# Patient Record
Sex: Female | Born: 1990
Health system: Southern US, Community
[De-identification: ages and names within clinical notes are randomized; demographics above are authoritative.]

## PROBLEM LIST (undated history)

## (undated) DIAGNOSIS — Z87410 Personal history of cervical dysplasia: Secondary | ICD-10-CM

## (undated) DIAGNOSIS — R102 Pelvic and perineal pain unspecified side: Secondary | ICD-10-CM

## (undated) DIAGNOSIS — A549 Gonococcal infection, unspecified: Secondary | ICD-10-CM

## (undated) DIAGNOSIS — A749 Chlamydial infection, unspecified: Secondary | ICD-10-CM

## (undated) DIAGNOSIS — Z8614 Personal history of Methicillin resistant Staphylococcus aureus infection: Secondary | ICD-10-CM

## (undated) DIAGNOSIS — K219 Gastro-esophageal reflux disease without esophagitis: Secondary | ICD-10-CM

## (undated) DIAGNOSIS — N920 Excessive and frequent menstruation with regular cycle: Secondary | ICD-10-CM

## (undated) DIAGNOSIS — D259 Leiomyoma of uterus, unspecified: Secondary | ICD-10-CM

## (undated) DIAGNOSIS — Z8619 Personal history of other infectious and parasitic diseases: Secondary | ICD-10-CM

## (undated) DIAGNOSIS — T7840XA Allergy, unspecified, initial encounter: Secondary | ICD-10-CM

## (undated) HISTORY — DX: Gastro-esophageal reflux disease without esophagitis: K21.9

## (undated) HISTORY — DX: Allergy, unspecified, initial encounter: T78.40XA

## (undated) HISTORY — PX: NO PAST SURGERIES: SHX2092

---
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
## 2010-03-22 ENCOUNTER — Emergency Department (HOSPITAL_COMMUNITY): Admission: EM | Admit: 2010-03-22 | Discharge: 2010-03-22 | Payer: Self-pay | Admitting: Emergency Medicine

## 2011-03-29 LAB — POCT URINALYSIS DIP (DEVICE)
Nitrite: NEGATIVE
Protein, ur: NEGATIVE mg/dL
Urobilinogen, UA: 1 mg/dL (ref 0.0–1.0)
pH: 7 (ref 5.0–8.0)

## 2011-03-29 LAB — WET PREP, GENITAL
Trich, Wet Prep: NONE SEEN
Yeast Wet Prep HPF POC: NONE SEEN

## 2011-03-29 LAB — GC/CHLAMYDIA PROBE AMP, GENITAL: Chlamydia, DNA Probe: POSITIVE — AB

## 2011-03-29 LAB — POCT PREGNANCY, URINE: Preg Test, Ur: NEGATIVE

## 2011-03-29 LAB — URINE CULTURE: Colony Count: 1000

## 2012-05-24 ENCOUNTER — Encounter (HOSPITAL_COMMUNITY): Payer: Self-pay | Admitting: *Deleted

## 2012-05-24 ENCOUNTER — Emergency Department (HOSPITAL_COMMUNITY)
Admission: EM | Admit: 2012-05-24 | Discharge: 2012-05-25 | Disposition: A | Discharge status: Home / Self Care | Payer: Self-pay | Attending: Emergency Medicine | Admitting: Emergency Medicine

## 2012-05-24 DIAGNOSIS — R21 Rash and other nonspecific skin eruption: Secondary | ICD-10-CM | POA: Insufficient documentation

## 2012-05-24 MED ORDER — FAMOTIDINE 20 MG PO TABS
20.0000 mg | ORAL_TABLET | Freq: Once | ORAL | Status: AC
  Administered 2012-05-25: 20 mg via ORAL
  Filled 2012-05-24: qty 1

## 2012-05-24 MED ORDER — DIPHENHYDRAMINE HCL 25 MG PO CAPS
25.0000 mg | ORAL_CAPSULE | Freq: Once | ORAL | Status: AC
  Administered 2012-05-25: 25 mg via ORAL
  Filled 2012-05-24: qty 1

## 2012-05-24 MED ORDER — PREDNISONE 20 MG PO TABS
40.0000 mg | ORAL_TABLET | Freq: Once | ORAL | Status: AC
  Administered 2012-05-25: 40 mg via ORAL
  Filled 2012-05-24: qty 2

## 2012-05-24 NOTE — ED Provider Notes (Signed)
History     CSN: [id number]  Arrival date & time 05/24/12  2034   First MD Initiated Contact with Patient 05/24/12 2305      Chief Complaint  Patient presents with  . Insect Bite    (Consider location/radiation/quality/duration/timing/severity/associated sxs/prior treatment) HPI Comments: Patient comes in today with a rash on her right arm, chest, and posterior left leg.  She reports that she first noticed the rash today.  She thinks that she may have been bitten by a mosquito.  She also reports that she recently started using a new body wash from [place] that she has never used before.  She reports that the rash does itch.  She has been putting Hydrocortisone cream on the area, which has helped with the itching.  She denies any shortness of breath or swelling of her throat, lips, or tongue.  Patient is a 21 y.o. female presenting with rash. The history is provided by the patient.  Rash  This is a new problem. The current episode started 12 to 24 hours ago. There has been no fever. The patient is experiencing no pain. Associated symptoms include itching. Pertinent negatives include no blisters, no pain and no weeping.    History reviewed. No pertinent past medical history.  History reviewed. No pertinent past surgical history.  History reviewed. No pertinent family history.  History  Substance Use Topics  . Smoking status: Not on file  . Smokeless tobacco: Not on file  . Alcohol Use: Yes    OB History    Grav Para Term Preterm Abortions TAB SAB Ect Mult Living                  Review of Systems  Constitutional: Negative for fever and chills.  HENT: Negative for sore throat.   Respiratory: Negative for shortness of breath and wheezing.   Gastrointestinal: Negative for nausea and vomiting.  Musculoskeletal: Negative for joint swelling.  Skin: Positive for itching and rash.  Neurological: Negative for headaches.    Allergies  Penicillins  Home Medications  No  current outpatient prescriptions on file.  BP 124/66  Pulse 88  Temp(Src) 98.3 F (36.8 C) (Oral)  Resp 20  Ht 5' (1.524 m)  Wt 125 lb (56.7 kg)  BMI 24.41 kg/m2  SpO2 100%  LMP 05/16/2012  Physical Exam  Nursing note and vitals reviewed. Constitutional: She is oriented to person, place, and time. She appears well-developed and well-nourished. No distress.  HENT:  Head: Normocephalic and atraumatic.  Mouth/Throat: Oropharynx is clear and moist and mucous membranes are normal.       No sign of airway obstruction. No edema of face, eyelids, lips, tongue, uvula.Marland Kitchen Uvula midline, no nasal congestion or drooling.  Tongue not elevated. No trismus.  Neck: Trachea normal, normal range of motion and full passive range of motion without pain. Neck supple.       No carotid bruits or stridor  Cardiovascular: Normal rate, regular rhythm, normal heart sounds, intact distal pulses and normal pulses.        Not tachycardic  Pulmonary/Chest: Effort normal and breath sounds normal. No stridor. No respiratory distress. She has no wheezes.  Musculoskeletal: Normal range of motion.  Neurological: She is alert and oriented to person, place, and time.  Skin: Skin is warm, dry and intact. Rash noted. No petechiae and no purpura noted. She is not diaphoretic.       Erythematous papular rash located on the left arm, chest, and right leg.  Psychiatric: She has a normal mood and affect. Her behavior is normal.    ED Course  Procedures (including critical care time)  Labs Reviewed - No data to display No results found.   No diagnosis found.    MDM  Patient presenting with erythematous papular rash located on the right arm, chest, and left leg.  No petechia or purpura.  Afebrile.  Patient recently started using a new body wash.  Feel that rash is most likely Allergic Dermatitis.  No swelling of tongue, lips, or throat.  No respiratory distress.  Patient given Prednisone, Benadryl, and Pepcid while in  the ED.  Patient instructed to stop using body wash.  Patient instructed to follow up with PCP.        Pascal Lux [place], PA-C 05/25/12 [phone]

## 2012-05-24 NOTE — ED Notes (Signed)
Pt c/o insect bite to right forearm and left breast. Noticed marks yesterday. States she is allergic to mosquito bites. No relief with home remedies. Reports rash itches and burns. Small blisters noted to arm.

## 2012-05-25 MED ORDER — PREDNISONE 20 MG PO TABS: 40.0000 mg | ORAL_TABLET | Freq: Every day | ORAL | Status: AC

## 2012-05-25 NOTE — ED Provider Notes (Signed)
Medical screening examination/treatment/procedure(s) were performed by non-physician practitioner and as supervising physician I was immediately available for consultation/collaboration.   Celene Kras, MD 05/25/12 1102

## 2012-05-25 NOTE — Discharge Instructions (Signed)

## 2012-05-25 NOTE — ED Notes (Signed)
Patient discharge via ambulatory with steady gait. Respirations equal and unlabored. Skin warm and dry. No acute distress noted. 

## 2012-08-23 ENCOUNTER — Inpatient Hospital Stay (HOSPITAL_COMMUNITY)
Admission: AD | Admit: 2012-08-23 | Discharge: 2012-08-23 | Disposition: A | Discharge status: Home / Self Care | Payer: Self-pay | Source: Ambulatory Visit | Attending: Obstetrics & Gynecology | Admitting: Obstetrics & Gynecology

## 2012-08-23 ENCOUNTER — Inpatient Hospital Stay (HOSPITAL_COMMUNITY): Payer: Self-pay

## 2012-08-23 ENCOUNTER — Encounter (HOSPITAL_COMMUNITY): Payer: Self-pay

## 2012-08-23 DIAGNOSIS — N949 Unspecified condition associated with female genital organs and menstrual cycle: Secondary | ICD-10-CM | POA: Insufficient documentation

## 2012-08-23 DIAGNOSIS — R1032 Left lower quadrant pain: Secondary | ICD-10-CM | POA: Insufficient documentation

## 2012-08-23 DIAGNOSIS — N938 Other specified abnormal uterine and vaginal bleeding: Secondary | ICD-10-CM | POA: Insufficient documentation

## 2012-08-23 DIAGNOSIS — N946 Dysmenorrhea, unspecified: Secondary | ICD-10-CM | POA: Insufficient documentation

## 2012-08-23 HISTORY — DX: Chlamydial infection, unspecified: A74.9

## 2012-08-23 HISTORY — DX: Gonococcal infection, unspecified: A54.9

## 2012-08-23 HISTORY — DX: Personal history of Methicillin resistant Staphylococcus aureus infection: Z86.14

## 2012-08-23 LAB — CBC
HCT: 37.4 % (ref 36.0–46.0)
MCHC: 34 g/dL (ref 30.0–36.0)
MCV: 87 fL (ref 78.0–100.0)
Platelets: 209 10*3/uL (ref 150–400)
RBC: 4.3 MIL/uL (ref 3.87–5.11)
RDW: 12.2 % (ref 11.5–15.5)
WBC: 8.6 10*3/uL (ref 4.0–10.5)

## 2012-08-23 LAB — WET PREP, GENITAL
Clue Cells Wet Prep HPF POC: NONE SEEN
Trich, Wet Prep: NONE SEEN
Yeast Wet Prep HPF POC: NONE SEEN

## 2012-08-23 MED ORDER — KETOROLAC TROMETHAMINE 60 MG/2ML IM SOLN
60.0000 mg | INTRAMUSCULAR | Status: AC
  Administered 2012-08-23: 60 mg via INTRAMUSCULAR
  Filled 2012-08-23: qty 2

## 2012-08-23 NOTE — MAU Provider Note (Signed)
Attestation of Attending Supervision of Advanced Practitioner (CNM/NP): Evaluation and management procedures were performed by the Advanced Practitioner under my supervision and collaboration.  I have reviewed the Advanced Practitioner's note and chart, and I agree with the management and plan.  HARRAWAY-SMITH, Feliza Diven 12:49 PM     

## 2012-08-23 NOTE — MAU Provider Note (Signed)
Chief Complaint: Vaginal Bleeding and Abdominal Pain   None    SUBJECTIVE HPI: Kimberly Cooper is a 21 y.o. No obstetric history on file. at Unknown by LMP who presents to maternity admissions reporting heavy vaginal bleeding and LLQ abdominal pain x24 hours.  She reports taking Plan B 3 weeks ago, and had some scant bleeding 1 week after this, approximately 2 weeks ago but has not had a normal period since taking this medication.  Patient's last menstrual period was 07/09/2012.  She is changing her pad every 2-3 hours today and denies seeing clots on the pad.  She denies vaginal itching/burning, urinary symptoms, h/a, dizziness, fatigue, n/v, or fever/chills.     History reviewed. No pertinent past medical history. History reviewed. No pertinent past surgical history. History   Social History  . Marital Status: Single    Spouse Name: N/A    Number of Children: N/A  . Years of Education: N/A   Occupational History  . Not on file.   Social History Main Topics  . Smoking status: Not on file  . Smokeless tobacco: Not on file  . Alcohol Use: Yes  . Drug Use: No  . Sexually Active:    Other Topics Concern  . Not on file   Social History Narrative  . No narrative on file   No current facility-administered medications on file prior to encounter.   No current outpatient prescriptions on file prior to encounter.   Allergies  Allergen Reactions  . Penicillins     ROS: Pertinent items in HPI  OBJECTIVE Blood pressure 121/69, pulse 108, temperature 98.3 F (36.8 C), temperature source Oral, resp. rate 22, last menstrual period 07/09/2012. GENERAL: Well-developed, well-nourished female in no acute distress.  HEENT: Normocephalic HEART: normal rate RESP: normal effort ABDOMEN: Soft, non-tender EXTREMITIES: Nontender, no edema NEURO: Alert and oriented Pelvic exam: Cervix pink, visually closed, without lesion, moderate amount bright red bleeding pooling in posterior fornix and  from cervical os, vaginal walls and external genitalia normal Bimanual exam: Cervix 0/long/high, firm, anterior, neg CMT, uterus mildly tender, nonenlarged, adnexa without tenderness, enlargement, or mass  LAB RESULTS Results for orders placed during the hospital encounter of 08/23/12 (from the past 24 hour(s))  POCT PREGNANCY, URINE     Status: Normal   Collection Time   08/23/12  9:49 AM      Component Value Range   Preg Test, Ur NEGATIVE  NEGATIVE  Wet prep/GCC pending  IMAGING US Transvaginal Non-ob  08/23/2012  *RADIOLOGY REPORT*  Clinical Data: Left lower quadrant pain.  Vaginal bleeding. Negative pregnancy test.  TRANSABDOMINAL AND TRANSVAGINAL ULTRASOUND OF PELVIS  Technique:  Both transabdominal and transvaginal ultrasound examinations of the pelvis were performed.  Transabdominal technique was performed for global imaging of the pelvis including uterus, ovaries, adnexal regions, and pelvic cul-de-sac.  It was necessary to proceed with endovaginal exam following the transabdominal exam to visualize the right ovary and endometrial stripe.  Comparison:  None.  Findings: Uterus:  7.7 x 3.0 x 3.9 cm.  No fibroids or other uterine mass identified.  Endometrium: Double layer thickness measures 6 mm transvaginally. No focal lesion visualized.  Right ovary: 2.4 x 1.4 x 2.9 cm.  2 cm avascular corpus luteum noted, but no ovarian or adnexal mass identified.  Left ovary: 3.0 x 1.9 x 2.3 cm.  Normal appearance.  No adnexal mass identified.  Other Findings:  Tiny amount of simple free fluid in the cul-de- sac.  IMPRESSION: No evidence of  pelvic mass or other significant abnormality.   Original Report Authenticated By: Danae Orleans, M.D.    US Pelvis Complete  08/23/2012  *RADIOLOGY REPORT*  Clinical Data: Left lower quadrant pain.  Vaginal bleeding. Negative pregnancy test.  TRANSABDOMINAL AND TRANSVAGINAL ULTRASOUND OF PELVIS  Technique:  Both transabdominal and transvaginal ultrasound examinations of the  pelvis were performed.  Transabdominal technique was performed for global imaging of the pelvis including uterus, ovaries, adnexal regions, and pelvic cul-de-sac.  It was necessary to proceed with endovaginal exam following the transabdominal exam to visualize the right ovary and endometrial stripe.  Comparison:  None.  Findings: Uterus:  7.7 x 3.0 x 3.9 cm.  No fibroids or other uterine mass identified.  Endometrium: Double layer thickness measures 6 mm transvaginally. No focal lesion visualized.  Right ovary: 2.4 x 1.4 x 2.9 cm.  2 cm avascular corpus luteum noted, but no ovarian or adnexal mass identified.  Left ovary: 3.0 x 1.9 x 2.3 cm.  Normal appearance.  No adnexal mass identified.  Other Findings:  Tiny amount of simple free fluid in the cul-de- sac.  IMPRESSION: No evidence of pelvic mass or other significant abnormality.   Original Report Authenticated By: Danae Orleans, M.D.     ASSESSMENT 1. Dysmenorrhea     PLAN Toradol 60 mg IM x1 dose in MAU with significant relief Discharge home with bleeding precautions Ibuprofen 400-600 mg OTC PRN pain Return to MAU as needed    Sharen Counter Certified Nurse-Midwife 08/23/2012  9:58 AM

## 2012-08-23 NOTE — MAU Note (Signed)
Patient is here with c/o heavy bleeding and abdominal pain. She states that she took plan b last month and had 2 days of spotting. She states that she have not had normal period.

## 2012-08-24 LAB — GC/CHLAMYDIA PROBE AMP, GENITAL: GC Probe Amp, Genital: NEGATIVE

## 2013-03-27 ENCOUNTER — Emergency Department (HOSPITAL_COMMUNITY)
Admission: EM | Admit: 2013-03-27 | Discharge: 2013-03-27 | Disposition: A | Discharge status: Home / Self Care | Payer: Self-pay | Attending: Emergency Medicine | Admitting: Emergency Medicine

## 2013-03-27 ENCOUNTER — Encounter (HOSPITAL_COMMUNITY): Payer: Self-pay | Admitting: Emergency Medicine

## 2013-03-27 DIAGNOSIS — L272 Dermatitis due to ingested food: Secondary | ICD-10-CM | POA: Insufficient documentation

## 2013-03-27 DIAGNOSIS — Z8619 Personal history of other infectious and parasitic diseases: Secondary | ICD-10-CM | POA: Insufficient documentation

## 2013-03-27 DIAGNOSIS — Z8614 Personal history of Methicillin resistant Staphylococcus aureus infection: Secondary | ICD-10-CM | POA: Insufficient documentation

## 2013-03-27 DIAGNOSIS — T7840XA Allergy, unspecified, initial encounter: Secondary | ICD-10-CM

## 2013-03-27 MED ORDER — LORATADINE 10 MG PO TABS: 10.0000 mg | ORAL_TABLET | Freq: Every day | ORAL | Status: DC

## 2013-03-27 MED ORDER — PREDNISONE 10 MG PO TABS: 10.0000 mg | ORAL_TABLET | Freq: Every day | ORAL | Status: DC

## 2013-03-27 NOTE — ED Notes (Signed)
MD at bedside. 

## 2013-03-27 NOTE — ED Provider Notes (Signed)
History     CSN: [id number]  Arrival date & time 03/27/13  1102   First MD Initiated Contact with Patient 03/27/13 1209      Chief Complaint  Patient presents with  . Allergic Reaction    (Consider location/radiation/quality/duration/timing/severity/associated sxs/prior Treatment)   HPI   patient presents to the emergency department with complaints of a rash to her h she said that she has she ate Cashews yesterday and her top lip got a little swollen and then she developed a rash to her hands and legs. She no longer has swelling to her face, shortness of breath, her throat is not tight. She continues to have a rash to her hands she has been scratching. She came into the emergency department for evaluation. Nad vss  Past Medical History  Diagnosis Date  . Chlamydia   . Gonorrhea   . MRSA infection greater than 3 months ago 2003    ABDOMINAL CYST    History reviewed. No pertinent past surgical history.  No family history on file.  History  Substance Use Topics  . Smoking status: Never Smoker   . Smokeless tobacco: Not on file  . Alcohol Use: Yes    OB History   Grav Para Term Preterm Abortions TAB SAB Ect Mult Living                  Review of Systems  All other systems reviewed and are negative.    Allergies  Penicillins  Home Medications   Current Outpatient Rx  Name  Route  Sig  Dispense  Refill  . loratadine (CLARITIN) 10 MG tablet   Oral   Take 1 tablet (10 mg total) by mouth daily.   14 tablet   0   . predniSONE (DELTASONE) 10 MG tablet   Oral   Take 1 tablet (10 mg total) by mouth daily.   21 tablet   0     6 tabs on day 1, 5 tabs on day 2, 4 tabs on day 3, ...     BP 118/71  Pulse 99  Temp(Src) 98.6 F (37 C) (Oral)  Resp 18  SpO2 100%  Physical Exam  Nursing note and vitals reviewed. Constitutional: She appears well-developed and well-nourished. No distress.  HENT:  Head: Normocephalic and atraumatic.  Mouth/Throat:  Oropharynx is clear and moist.  Eyes: Pupils are equal, round, and reactive to light.  Neck: Normal range of motion. Neck supple.  Cardiovascular: Normal rate and regular rhythm.   Pulmonary/Chest: Effort normal. She has no wheezes. She has no rales.  Abdominal: Soft.  Neurological: She is alert.  Skin: Skin is warm and dry.  Dermatitis to hands and lower extremities that is excoriated.     ED Course  Procedures (including critical care time)  Labs Reviewed - No data to display No results found.   1. Allergic reaction, initial encounter       MDM  Pt advised to take benadryl at home. No concerning symptoms of angioedema at this time. Pts symptoms are mild, vital signs are stable. She has never had an allergy to nuts and is unsure if this is what caused it. Advised to stay away from nuts for now on.  rx: Prednisone and Claritin.  Pt has been advised of the symptoms that warrant their return to the ED. Patient has voiced understanding and has agreed to follow-up with the PCP or specialist.         Dorthula Matas, PA-C  03/27/13 1232 

## 2013-03-27 NOTE — ED Notes (Signed)
Pt states that yesterday she ate cashews and noticed some swelling to lips and took benadryl. No distress noted at this time. Intermit sob

## 2013-03-29 NOTE — ED Provider Notes (Signed)
Medical screening examination/treatment/procedure(s) were performed by non-physician practitioner and as supervising physician I was immediately available for consultation/collaboration.   Suzi Roots, MD 03/29/13 [phone]

## 2014-05-26 ENCOUNTER — Emergency Department (HOSPITAL_COMMUNITY)
Admission: EM | Admit: 2014-05-26 | Discharge: 2014-05-26 | Disposition: A | Discharge status: Home / Self Care | Payer: PRIVATE HEALTH INSURANCE | Attending: Emergency Medicine | Admitting: Emergency Medicine

## 2014-05-26 ENCOUNTER — Encounter (HOSPITAL_COMMUNITY): Payer: Self-pay | Admitting: Emergency Medicine

## 2014-05-26 DIAGNOSIS — Z8614 Personal history of Methicillin resistant Staphylococcus aureus infection: Secondary | ICD-10-CM | POA: Insufficient documentation

## 2014-05-26 DIAGNOSIS — Z880 Allergy status to penicillin: Secondary | ICD-10-CM | POA: Insufficient documentation

## 2014-05-26 DIAGNOSIS — Z8619 Personal history of other infectious and parasitic diseases: Secondary | ICD-10-CM | POA: Insufficient documentation

## 2014-05-26 DIAGNOSIS — Y990 Civilian activity done for income or pay: Secondary | ICD-10-CM | POA: Insufficient documentation

## 2014-05-26 DIAGNOSIS — S39012A Strain of muscle, fascia and tendon of lower back, initial encounter: Secondary | ICD-10-CM

## 2014-05-26 DIAGNOSIS — S335XXA Sprain of ligaments of lumbar spine, initial encounter: Secondary | ICD-10-CM | POA: Insufficient documentation

## 2014-05-26 DIAGNOSIS — Y929 Unspecified place or not applicable: Secondary | ICD-10-CM | POA: Insufficient documentation

## 2014-05-26 DIAGNOSIS — X500XXA Overexertion from strenuous movement or load, initial encounter: Secondary | ICD-10-CM | POA: Insufficient documentation

## 2014-05-26 DIAGNOSIS — Y9389 Activity, other specified: Secondary | ICD-10-CM | POA: Insufficient documentation

## 2014-05-26 MED ORDER — PREDNISONE 50 MG PO TABS: 50.0000 mg | ORAL_TABLET | Freq: Every day | ORAL | Status: DC

## 2014-05-26 MED ORDER — IBUPROFEN 800 MG PO TABS
800.0000 mg | ORAL_TABLET | Freq: Once | ORAL | Status: AC
  Administered 2014-05-26: 800 mg via ORAL
  Filled 2014-05-26: qty 1

## 2014-05-26 MED ORDER — HYDROCODONE-ACETAMINOPHEN 5-325 MG PO TABS
1.0000 | ORAL_TABLET | Freq: Once | ORAL | Status: AC
  Administered 2014-05-26: 1 via ORAL
  Filled 2014-05-26: qty 1

## 2014-05-26 MED ORDER — HYDROCODONE-ACETAMINOPHEN 5-325 MG PO TABS: 1.0000 | ORAL_TABLET | Freq: Four times a day (QID) | ORAL | Status: DC | PRN

## 2014-05-26 MED ORDER — IBUPROFEN 800 MG PO TABS: 800.0000 mg | ORAL_TABLET | Freq: Three times a day (TID) | ORAL | Status: DC | PRN

## 2014-05-26 MED ORDER — CYCLOBENZAPRINE HCL 10 MG PO TABS: 10.0000 mg | ORAL_TABLET | Freq: Three times a day (TID) | ORAL | Status: DC | PRN

## 2014-05-26 NOTE — ED Notes (Signed)
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

## 2014-05-26 NOTE — ED Provider Notes (Signed)
CSN: [id number]     Arrival date & time 05/26/14  2032 History   First MD Initiated Contact with Patient 05/26/14 2046    This chart was scribed for non-physician practitioner, Irena Cords, PA, working with Babette Relic, MD by Terressa Koyanagi, ED Scribe. This patient was seen in room WTR8/WTR8 and the patient's care was started at 9:20 PM.  Chief Complaint  Patient presents with  . Back Pain   The history is provided by the patient. No language interpreter was used.   HPI Comments: Kimberly Cooper is a 23 y.o. female, with a history of chlamydia, gonorrhea, and MRSA, who presents to the Emergency Department complaining of back pain onset around 6:15PM. Pt reports that she was at work  and while lifting someone up in a bed she began to experience sharp, shooting pain across the middle of her back and left side of her back. Pt reports using heating pad at home without much relief. Pt denies nausea or vomiting.   Past Medical History  Diagnosis Date  . Chlamydia   . Gonorrhea   . MRSA infection greater than 3 months ago 2003    ABDOMINAL CYST   History reviewed. No pertinent past surgical history. No family history on file. History  Substance Use Topics  . Smoking status: Never Smoker   . Smokeless tobacco: Not on file  . Alcohol Use: Yes   OB History   Grav Para Term Preterm Abortions TAB SAB Ect Mult Living                 Review of Systems  Musculoskeletal: Positive for back pain.    A complete 10 system review of systems was obtained and all systems are negative except as noted in the HPI and PMH.    Allergies  Other and Penicillins  Home Medications   Prior to Admission medications   Not on File   Triage Vitals: BP 135/65  Pulse 85  Temp(Src) 98.5 F (36.9 C) (Oral)  Resp 18  SpO2 99%  LMP 05/23/2014  Physical Exam  Nursing note and vitals reviewed. Constitutional: She is oriented to person, place, and time. She appears well-developed and well-nourished. No  distress.  HENT:  Head: Normocephalic and atraumatic.  Eyes: Conjunctivae and EOM are normal.  Neck: Normal range of motion. No tracheal deviation present.  Cardiovascular: Normal rate.   Pulmonary/Chest: Effort normal. No respiratory distress.  Musculoskeletal: Normal range of motion.       Lumbar back: She exhibits tenderness, pain and spasm. She exhibits normal range of motion, no bony tenderness, no edema, no deformity, no laceration and normal pulse.       Back:  Neurological: She is alert and oriented to person, place, and time. She has normal reflexes. No sensory deficit. She exhibits normal muscle tone. Coordination and gait normal. GCS eye subscore is 4. GCS verbal subscore is 5. GCS motor subscore is 6.  Skin: Skin is warm and dry.  Psychiatric: She has a normal mood and affect. Her behavior is normal.    ED Course  Procedures (including critical care time) DIAGNOSTIC STUDIES:   COORDINATION OF CARE: 9:23 PM-Discussed treatment plan which includes meds, icing the affected area, work note, light activity, with pt. Patient verbalizes understanding and agrees with treatment plan.  Patient retreated for lumbar and thoracic strain based on her mechanism of injury.  The patient is advised to return here as needed.  Patient is advised to use ice and heat  on areas that are hurting   Brent General, PA-C 05/27/14 [phone]

## 2014-05-26 NOTE — ED Notes (Signed)
Pt took 600mg  Ibuprofen

## 2014-05-26 NOTE — Discharge Instructions (Signed)
Return here as needed. Follow up with your doctor. Ice and heat on your back.

## 2014-05-27 NOTE — ED Provider Notes (Signed)
Medical screening examination/treatment/procedure(s) were performed by non-physician practitioner and as supervising physician I was immediately available for consultation/collaboration.   EKG Interpretation None       Babette Relic, MD 05/27/14 1409

## 2014-06-04 ENCOUNTER — Ambulatory Visit: Payer: Self-pay

## 2014-06-04 ENCOUNTER — Other Ambulatory Visit: Payer: Self-pay | Admitting: Occupational Medicine

## 2014-06-04 DIAGNOSIS — R52 Pain, unspecified: Secondary | ICD-10-CM

## 2016-02-28 DIAGNOSIS — L810 Postinflammatory hyperpigmentation: Secondary | ICD-10-CM | POA: Diagnosis not present

## 2016-02-28 DIAGNOSIS — L910 Hypertrophic scar: Secondary | ICD-10-CM | POA: Diagnosis not present

## 2016-02-28 DIAGNOSIS — L700 Acne vulgaris: Secondary | ICD-10-CM | POA: Diagnosis not present

## 2016-02-28 MED FILL — TRETINOIN 0.01% GEL: 0.01 | 30 days supply | Qty: 15 | Fill #0

## 2016-02-28 MED FILL — SOD SULF 8%/4% TOP SUSPENS: 8-4 | 25 days supply | Qty: 473 | Fill #0

## 2016-03-18 DIAGNOSIS — D485 Neoplasm of uncertain behavior of skin: Secondary | ICD-10-CM | POA: Diagnosis not present

## 2016-05-29 MED FILL — TRETINOIN 0.01% GEL: 0.01 | 30 days supply | Qty: 15 | Fill #1

## 2016-07-17 ENCOUNTER — Encounter: Payer: Self-pay | Admitting: Medical

## 2016-07-17 ENCOUNTER — Ambulatory Visit (INDEPENDENT_AMBULATORY_CARE_PROVIDER_SITE_OTHER): Payer: 59 | Admitting: Medical

## 2016-07-17 VITALS — BP 120/82 | HR 74 | Wt 154.0 lb

## 2016-07-17 DIAGNOSIS — R102 Pelvic and perineal pain: Secondary | ICD-10-CM | POA: Insufficient documentation

## 2016-07-17 DIAGNOSIS — N941 Unspecified dyspareunia: Secondary | ICD-10-CM | POA: Diagnosis not present

## 2016-07-17 DIAGNOSIS — R42 Dizziness and giddiness: Secondary | ICD-10-CM | POA: Insufficient documentation

## 2016-07-17 DIAGNOSIS — R195 Other fecal abnormalities: Secondary | ICD-10-CM | POA: Diagnosis not present

## 2016-07-17 LAB — CBC WITH DIFFERENTIAL/PLATELET
BASOS ABS: 0 {cells}/uL (ref 0–200)
Basophils Relative: 0 %
EOS PCT: 2 %
Eosinophils Absolute: 132 cells/uL (ref 15–500)
HCT: 37.9 % (ref 35.0–45.0)
HEMOGLOBIN: 12.5 g/dL (ref 11.7–15.5)
Lymphocytes Relative: 29 %
Lymphs Abs: 1914 cells/uL (ref 850–3900)
MCH: 30.3 pg (ref 27.0–33.0)
MCHC: 33 g/dL (ref 32.0–36.0)
MCV: 91.8 fL (ref 80.0–100.0)
MONOS PCT: 6 %
MPV: 12.1 fL (ref 7.5–12.5)
Monocytes Absolute: 396 cells/uL (ref 200–950)
NEUTROS PCT: 63 %
Neutro Abs: 4158 cells/uL (ref 1500–7800)
Platelets: 221 10*3/uL (ref 140–400)
RBC: 4.13 MIL/uL (ref 3.80–5.10)
RDW: 13 % (ref 11.0–15.0)
WBC: 6.6 10*3/uL (ref 4.0–10.5)

## 2016-07-17 LAB — POCT URINALYSIS DIPSTICK
BILIRUBIN UA: NEGATIVE
Glucose, UA: NEGATIVE
KETONES UA: NEGATIVE
Leukocytes, UA: NEGATIVE
NITRITE UA: NEGATIVE
PH UA: 6
Protein, UA: NEGATIVE
Urobilinogen, UA: NEGATIVE

## 2016-07-17 LAB — POCT URINE PREGNANCY: Preg Test, Ur: NEGATIVE

## 2016-07-17 LAB — TSH: TSH: 1.56 m[IU]/L

## 2016-07-17 NOTE — Progress Notes (Signed)
Subjective:     Patient ID: Kimberly Cooper, female   DOB: 1991-07-15, 25 y.o.   MRN: [id number]  HPI Chief Complaint  Patient presents with  . New Patient (Initial Visit)    feeling light headed off and on over the past month but is getting more common. this week has been the worst. nauseaous. when on her period she has loose stool so she can feel her body changing and her cramps are worse.    New patient today.  Only has been seeing Dr. Neal/gyn prior.  She notes feeling dizzy, light headed, nausea some, but nausea likely related to her periods.  Been feeling this way the past month.  Dizzy like blurred feeling in vision, can't focus, off balance, but not like room spinning.  Dizziness lasts all day.  Denies ringing in the ears, no hearing loss.    Having some loose stools during periods, multiple times daily, but not when not on her periods.  Denies bad cramping.  Denies heavy periods.  Periods are regular.   She notes her sister has rough periods too, has vomiting, bad cramping.   LMP currently.  Not on birth control.   No recent labs.   No hx/o anemia, but mother is anemic.  Denies blood in stool, no unusual bleeding or bruising.  Eats 3 times daily, drinks some water.  Denies URI or allergy symptoms.  No recent head trauma or injury.  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   Denies dizziness, chest pain or other symptoms with exercise.    Sometimes can get tingling n hands but this has been for months, not new.  Is bilateral.  No weakness, numbness or tingling in feet.   Denies wearing glasses or contacts.    Past Medical History:  Diagnosis Date  . Allergy   . Chlamydia   . GERD (gastroesophageal reflux disease)   . Gonorrhea   . MRSA infection greater than 3 months ago 2003   ABDOMINAL CYST   Family History  Problem Relation Age of Onset  . Hypertension Mother   . Heart disease Father 22    MI  . Other Father     gunshot, paralysis  . Cancer Maternal Grandfather     lung  . Cancer Other     GGM has colon cancer, other great aunts and uncles with cancer   Social History   Social History  . Marital status: Single    Spouse name: N/A  . Number of children: N/A  . Years of education: N/A   Occupational History  . Not on file.   Social History Main Topics  . Smoking status: Never Smoker  . Smokeless tobacco: Never Used  . Alcohol use 1.2 oz/week    1 Glasses of wine, 1 Cans of beer per week  . Drug use: No  . Sexual activity: Yes    Birth control/ protection: None   Other Topics Concern  . Not on file   Social History Narrative  . No narrative on file     Review of Systems      Objective:   Physical Exam  BP 120/82   Pulse 74   Wt 154 lb (69.9 kg)   LMP 07/16/2016   BMI 30.08 kg/m   General appearance: alert, no distress, WD/WN, pleasant AA female HEENT: normocephalic, sclerae anicteric, PERRLA, EOMi, nares patent, no discharge or erythema, pharynx normal Oral cavity: MMM, no lesions Neck: supple, no lymphadenopathy, no thyromegaly, no masses,  no bruits Heart: RRR, normal S1, S2, no murmurs Lungs: CTA bilaterally, no wheezes, rhonchi, or rales Abdomen: +bs, soft, non tender, non distended, no masses, no hepatomegaly, no splenomegaly Extremities: no edema, no cyanosis, no clubbing Pulses:1+ pedal pulses, otherwise 2+ symmetric, upper extremities, normal cap refill Neurological: alert, oriented x 3, CN2-12 intact, strength normal upper extremities and lower extremities, sensation normal throughout, DTRs 2+ throughout, no cerebellar signs, gait normal Psychiatric: normal affect, behavior normal, pleasant        Assessment:     Encounter Diagnoses  Name Primary?  . Dizziness and giddiness Yes  . Loose stools   . Dyspareunia, female   . Pelvic pain in female       Plan:      Dizziness - Discussed symptoms, concerns, possible causes including arrhythmia, anemia, electrolyte abnormality, vertigo, allergies/URI, tumor,  or other.  advised she not skip meals, drink plenty of water daily and based on history, doubt this is a nutritionist or hydration issue.   She does have significant heart disease history in father, but cardiovascular exam today ok except pulses less palpable in feet/legs.  Consider EKG or other if labs normal.   No indication for head scan at this point.  Vision exam normal UA reviewed, Upreg negative Labs today  Loose stools during menstrual periods, pelvic pain, dyspareunia - discussed possibility of endometriosis or other.   She will call her gynecologist back to inquire, recheck with gyn.     F/u pending labs.

## 2016-07-18 LAB — BASIC METABOLIC PANEL
BUN: 6 mg/dL — ABNORMAL LOW (ref 7–25)
CALCIUM: 9 mg/dL (ref 8.6–10.2)
CO2: 25 mmol/L (ref 20–31)
Chloride: 108 mmol/L (ref 98–110)
Creat: 0.63 mg/dL (ref 0.50–1.10)
GLUCOSE: 81 mg/dL (ref 65–99)
Potassium: 4.1 mmol/L (ref 3.5–5.3)
SODIUM: 141 mmol/L (ref 135–146)

## 2016-07-20 ENCOUNTER — Other Ambulatory Visit: Payer: Self-pay | Admitting: Medical

## 2016-07-20 MED ORDER — MECLIZINE HCL 25 MG PO TABS: 25.0000 mg | ORAL_TABLET | Freq: Two times a day (BID) | ORAL | 0 refills | Status: DC

## 2016-07-20 MED FILL — MECLIZINE 25 MG TABLET: 25 | 15 days supply | Qty: 30 | Fill #0

## 2016-10-12 ENCOUNTER — Encounter: Payer: Self-pay | Admitting: Skilled Nursing Facility1

## 2016-10-12 ENCOUNTER — Encounter: Payer: 59 | Attending: Medical | Admitting: Skilled Nursing Facility1

## 2016-10-12 DIAGNOSIS — Z713 Dietary counseling and surveillance: Secondary | ICD-10-CM | POA: Insufficient documentation

## 2016-10-12 NOTE — Progress Notes (Signed)
Patient was seen on 10/12/2016 for the Weight Loss Class at the [facility]. The following learning objectives were met by the patient during this class:   Describe healthy choices in each food group  Describe portion size of foods  Use plate method for meal planning  Demonstrate how to read Nutrition Facts food label  Set realistic goals for weight loss, diet changes, and physical activity.   Goals:  1. Make healthy food choices in each food group.  2. Reduce portion size of foods.  3. Increase fruit and vegetable intake.  4. Use plate method for meal planning.  5. Increase physical activity.    Handouts given:   1. Nutrition Strategies for Weight Loss   2.Low Sodium Seasonings Options  4. Weight Management Recipe Resources   5. Bake, Broil, [place]

## 2016-10-16 MED FILL — TRETINOIN 0.01% GEL: 0.01 | 90 days supply | Qty: 45 | Fill #0

## 2016-11-30 ENCOUNTER — Encounter: Payer: Self-pay | Admitting: Medical

## 2016-11-30 ENCOUNTER — Ambulatory Visit (INDEPENDENT_AMBULATORY_CARE_PROVIDER_SITE_OTHER): Payer: 59 | Admitting: Medical

## 2016-11-30 VITALS — BP 110/70 | HR 81 | Wt 154.4 lb

## 2016-11-30 DIAGNOSIS — K219 Gastro-esophageal reflux disease without esophagitis: Secondary | ICD-10-CM | POA: Diagnosis not present

## 2016-11-30 MED ORDER — OMEPRAZOLE 40 MG PO CPDR: 40.0000 mg | DELAYED_RELEASE_CAPSULE | Freq: Every day | ORAL | 0 refills | Status: DC

## 2016-11-30 MED ORDER — DEXLANSOPRAZOLE 30 MG PO CPDR: 30.0000 mg | DELAYED_RELEASE_CAPSULE | Freq: Every day | ORAL | 0 refills | Status: DC

## 2016-11-30 NOTE — Patient Instructions (Signed)
Begin Dexilant samples twice daily until you run out  Then begin Omeprazole once daily in the morning 30 minutes before breakfasts for the next 2 weeks  Cut back on spicy, acidic foods, fast food, fried foods  don't eat or drink within 2 hours of bedtime  If not much improved in 2-3 weeks, then recheck   Gastroesophageal Reflux Disease, Adult Gastroesophageal reflux disease (GERD) happens when acid from your stomach flows up into the esophagus. When acid comes in contact with the esophagus, the acid causes soreness (inflammation) in the esophagus. Over time, GERD may create small holes (ulcers) in the lining of the esophagus. CAUSES   Increased body weight. This puts pressure on the stomach, making acid rise from the stomach into the esophagus.   Smoking. This increases acid production in the stomach.   Drinking alcohol. This causes decreased pressure in the lower esophageal sphincter (valve or ring of muscle between the esophagus and stomach), allowing acid from the stomach into the esophagus.   Late evening meals and a full stomach. This increases pressure and acid production in the stomach.   A malformed lower esophageal sphincter.  Sometimes, no cause is found. SYMPTOMS   Burning pain in the lower part of the mid-chest behind the breastbone and in the mid-stomach area. This may occur twice a week or more often.   Trouble swallowing.   Sore throat.   Dry cough.   Asthma-like symptoms including chest tightness, shortness of breath, or wheezing.  DIAGNOSIS  Your caregiver may be able to diagnose GERD based on your symptoms. In some cases, X-rays and other tests may be done to check for complications or to check the condition of your stomach and esophagus. TREATMENT  Your caregiver may recommend over-the-counter or prescription medicines to help decrease acid production. Ask your caregiver before starting or adding any new medicines.  HOME CARE INSTRUCTIONS   Change the  factors that you can control. Ask your caregiver for guidance concerning weight loss, quitting smoking, and alcohol consumption.   Avoid foods and drinks that make your symptoms worse, such as:   Caffeine or alcoholic drinks.   Chocolate.   Peppermint or mint flavorings.   Garlic and onions.   Spicy foods.   Citrus fruits, such as oranges, lemons, or limes.   Tomato-based foods such as sauce, chili, salsa, and pizza.   Fried and fatty foods.   Avoid lying down for the 3 hours prior to your bedtime or prior to taking a nap.   Eat small, frequent meals instead of large meals.   Wear loose-fitting clothing. Do not wear anything tight around your waist that causes pressure on your stomach.   Raise the head of your bed 6 to 8 inches with wood blocks to help you sleep. Extra pillows will not help.   Only take over-the-counter or prescription medicines for pain, discomfort, or fever as directed by your caregiver.   Do not take aspirin, ibuprofen, or other nonsteroidal anti-inflammatory drugs (NSAIDs).  SEEK IMMEDIATE MEDICAL CARE IF:   You have pain in your arms, neck, jaw, teeth, or back.   Your pain increases or changes in intensity or duration.   You develop nausea, vomiting, or sweating (diaphoresis).   You develop shortness of breath, or you faint.   Your vomit is green, yellow, black, or looks like coffee grounds or blood.   Your stool is red, bloody, or black.  These symptoms could be signs of other problems, such as heart disease,  gastric bleeding, or esophageal bleeding. MAKE SURE YOU:   Understand these instructions.   Will watch your condition.   Will get help right away if you are not doing well or get worse.  Document Released: 09/16/2005 Document Revised: 08/19/2011 Document Reviewed: 06/26/2011 [facility] Patient Information 2012 [place].

## 2016-11-30 NOTE — Progress Notes (Signed)
Subjective: Chief Complaint  Patient presents with  . heartburn    heart burn , acid refulx    Here for heartburn, GERD.   Been having 2-3 week hx/o chest discomfort, upper belly discomfort, heartburn.  Has recently tried to cut back on caffeine.  However, she notes no real diet discretion.  Typically drinks orange juice and lemonade daily, eats a lot of tomatoes, pizza, and eats fast food at least once daily.  Doesn't eat a lot of chocolate.   Does eat a good amount of spicy foods.  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  Using Pepto bismol, tums.   Is a nonsmoker.  Drinks occasional alcohol.   No SOB, edema, palpitations, no chest pain with activity. No other aggravating or relieving factors. No other complaint.   Past Medical History:  Diagnosis Date  . Allergy   . Chlamydia   . GERD (gastroesophageal reflux disease)   . Gonorrhea   . MRSA infection greater than 3 months ago 2003   ABDOMINAL CYST   No current outpatient prescriptions on file prior to visit.   No current facility-administered medications on file prior to visit.    ROS as in subjective   Objective BP 110/70   Pulse 81   Wt 154 lb 6.4 oz (70 kg)   SpO2 99%   BMI 30.15 kg/m   General appearance: alert, no distress, WD/WN HEENT: normocephalic, sclerae anicteric, TMs pearly, nares patent, no discharge or erythema, pharynx normal Oral cavity: MMM, no lesions Neck: supple, no lymphadenopathy, no thyromegaly, no masses Heart: RRR, normal S1, S2, no murmurs Lungs: CTA bilaterally, no wheezes, rhonchi, or rales Abdomen: +bs, soft, non tender, non distended, no masses, no hepatomegaly, no splenomegaly Pulses: 2+ symmetric, upper and lower extremities, normal cap refill Ext: no edema   Assessment: Encounter Diagnosis  Name Primary?  . Gastroesophageal reflux disease without esophagitis Yes    Plan: Discussed symptoms, risk factors for GERD, differential.     recommendations  Begin Dexilant samples twice daily until you run out  Then begin Omeprazole once daily in the morning 30 minutes before breakfasts for the next 2 weeks  Cut back on spicy, acidic foods, fast food, fried foods  don't eat or drink within 2 hours of bedtime  If not much improved in 2-3 weeks, then recheck  Chelse was seen today for heartburn.  Diagnoses and all orders for this visit:  Gastroesophageal reflux disease without esophagitis  Other orders -     omeprazole (PRILOSEC) 40 MG capsule; Take 1 capsule (40 mg total) by mouth daily. -     Dexlansoprazole (DEXILANT) 30 MG capsule; Take 1 capsule (30 mg total) by mouth daily.

## 2017-01-28 ENCOUNTER — Ambulatory Visit (INDEPENDENT_AMBULATORY_CARE_PROVIDER_SITE_OTHER): Payer: 59 | Admitting: Medical

## 2017-01-28 ENCOUNTER — Encounter: Payer: Self-pay | Admitting: Medical

## 2017-01-28 VITALS — BP 108/70 | HR 68 | Wt 156.6 lb

## 2017-01-28 DIAGNOSIS — N644 Mastodynia: Secondary | ICD-10-CM

## 2017-01-28 DIAGNOSIS — N926 Irregular menstruation, unspecified: Secondary | ICD-10-CM

## 2017-01-28 LAB — POCT URINE PREGNANCY: PREG TEST UR: NEGATIVE

## 2017-01-28 NOTE — Patient Instructions (Signed)
Contraception Choices Contraception (birth control) is the use of any methods or devices to prevent pregnancy. Below are some methods to help avoid pregnancy. Hormonal methods  Contraceptive implant. This is a thin, plastic tube containing progesterone hormone. It does not contain estrogen hormone. Your health care provider inserts the tube in the inner part of the upper arm. The tube can remain in place for up to 3 years. After 3 years, the implant must be removed. The implant prevents the ovaries from releasing an egg (ovulation), thickens the cervical mucus to prevent sperm from entering the uterus, and thins the lining of the inside of the uterus.  Progesterone-only injections. These injections are given every 3 months by your health care provider to prevent pregnancy. This synthetic progesterone hormone stops the ovaries from releasing eggs. It also thickens cervical mucus and changes the uterine lining. This makes it harder for sperm to survive in the uterus.  Birth control pills. These pills contain estrogen and progesterone hormone. They work by preventing the ovaries from releasing eggs (ovulation). They also cause the cervical mucus to thicken, preventing the sperm from entering the uterus. Birth control pills are prescribed by a health care provider.Birth control pills can also be used to treat heavy periods.  Minipill. This type of birth control pill contains only the progesterone hormone. They are taken every day of each month and must be prescribed by your health care provider.  Birth control patch. The patch contains hormones similar to those in birth control pills. It must be changed once a week and is prescribed by a health care provider.  Vaginal ring. The ring contains hormones similar to those in birth control pills. It is left in the vagina for 3 weeks, removed for 1 week, and then a new one is put back in place. The patient must be comfortable inserting and removing the ring from  the vagina.A health care provider's prescription is necessary.  Emergency contraception. Emergency contraceptives prevent pregnancy after unprotected sexual intercourse. This pill can be taken right after sex or up to 5 days after unprotected sex. It is most effective the sooner you take the pills after having sexual intercourse. Most emergency contraceptive pills are available without a prescription. Check with your pharmacist. Do not use emergency contraception as your only form of birth control. Barrier methods  Female condom. This is a thin sheath (latex or rubber) that is worn over the penis during sexual intercourse. It can be used with spermicide to increase effectiveness.  Female condom. This is a soft, loose-fitting sheath that is put into the vagina before sexual intercourse.  Diaphragm. This is a soft, latex, dome-shaped barrier that must be fitted by a health care provider. It is inserted into the vagina, along with a spermicidal jelly. It is inserted before intercourse. The diaphragm should be left in the vagina for 6 to 8 hours after intercourse.  Cervical cap. This is a round, soft, latex or plastic cup that fits over the cervix and must be fitted by a health care provider. The cap can be left in place for up to 48 hours after intercourse.  Sponge. This is a soft, circular piece of polyurethane foam. The sponge has spermicide in it. It is inserted into the vagina after wetting it and before sexual intercourse.  Spermicides. These are chemicals that kill or block sperm from entering the cervix and uterus. They come in the form of creams, jellies, suppositories, foam, or tablets. They do not require a prescription. They   are inserted into the vagina with an applicator before having sexual intercourse. The process must be repeated every time you have sexual intercourse. Intrauterine contraception  Intrauterine device (IUD). This is a T-shaped device that is put in a woman's uterus during  a menstrual period to prevent pregnancy. There are 2 types: ? Copper IUD. This type of IUD is wrapped in copper wire and is placed inside the uterus. Copper makes the uterus and fallopian tubes produce a fluid that kills sperm. It can stay in place for 10 years. ? Hormone IUD. This type of IUD contains the hormone progestin (synthetic progesterone). The hormone thickens the cervical mucus and prevents sperm from entering the uterus, and it also thins the uterine lining to prevent implantation of a fertilized egg. The hormone can weaken or kill the sperm that get into the uterus. It can stay in place for 3-5 years, depending on which type of IUD is used. Permanent methods of contraception  Female tubal ligation. This is when the woman's fallopian tubes are surgically sealed, tied, or blocked to prevent the egg from traveling to the uterus.  Hysteroscopic sterilization. This involves placing a small coil or insert into each fallopian tube. Your doctor uses a technique called hysteroscopy to do the procedure. The device causes scar tissue to form. This results in permanent blockage of the fallopian tubes, so the sperm cannot fertilize the egg. It takes about 3 months after the procedure for the tubes to become blocked. You must use another form of birth control for these 3 months.  Female sterilization. This is when the female has the tubes that carry sperm tied off (vasectomy).This blocks sperm from entering the vagina during sexual intercourse. After the procedure, the man can still ejaculate fluid (semen). Natural planning methods  Natural family planning. This is not having sexual intercourse or using a barrier method (condom, diaphragm, cervical cap) on days the woman could become pregnant.  Calendar method. This is keeping track of the length of each menstrual cycle and identifying when you are fertile.  Ovulation method. This is avoiding sexual intercourse during ovulation.  Symptothermal method.  This is avoiding sexual intercourse during ovulation, using a thermometer and ovulation symptoms.  Post-ovulation method. This is timing sexual intercourse after you have ovulated. Regardless of which type or method of contraception you choose, it is important that you use condoms to protect against the transmission of sexually transmitted infections (STIs). Talk with your health care provider about which form of contraception is most appropriate for you. This information is not intended to replace advice given to you by your health care provider. Make sure you discuss any questions you have with your health care provider. Document Released: 12/07/2005 Document Revised: 05/14/2016 Document Reviewed: 06/01/2013 Elsevier Interactive Patient Education  2017 [employer]  

## 2017-01-28 NOTE — Progress Notes (Signed)
Subjective: Chief Complaint  Patient presents with  . possible pregnacy    possible prengnacy    Here for possible pregnancy.  LMP 12/21/16, lasted 2 days.   Periods typically are regular.  Just missed the February period.  Prior to January were regular.  Took two home pregnancy test 2 days day, at different times that day. One was + one was -.    No nausea. Not on birth control.  Uses condoms, all the time.  On 12/28/16 had intercourse and there was nothing in the condom afterwards, so she got worried.  No prior pregnancy.  No new sexual partners.   Been with boyfriend since age 50yo.   No vaginal discharge, no current concern for STD.   Not trying to pregnant.  Doesn't like how her body reacts with birth control.  No hx/o DVT or PE.   Mother had 2 miscarriages.  She does see [facility] for [facility], due for pap soon.  Gets breast tenderness ongoing for years.  Wouldn't mind being on birth control but doesn't want breakthrough bleeding like she had prior with oral birth control.   No other aggravating or relieving factors. No other complaint.  Past Medical History:  Diagnosis Date  . Allergy   . Chlamydia   . GERD (gastroesophageal reflux disease)   . Gonorrhea   . MRSA infection greater than 3 months ago 2003   ABDOMINAL CYST    Current Outpatient Prescriptions on File Prior to Visit  Medication Sig Dispense Refill  . Dexlansoprazole (DEXILANT) 30 MG capsule Take 1 capsule (30 mg total) by mouth daily. (Patient not taking: Reported on 01/28/2017) 15 capsule 0  . omeprazole (PRILOSEC) 40 MG capsule Take 1 capsule (40 mg total) by mouth daily. (Patient not taking: Reported on 01/28/2017) 30 capsule 0   No current facility-administered medications on file prior to visit.    Family History  Problem Relation Age of Onset  . Hypertension Mother   . Heart disease Father 61    MI  . Other Father     gunshot, paralysis  . Cancer Maternal Grandfather     lung  . Cancer Other     GGM has colon cancer,  other great aunts and uncles with cancer   ROS as in subjective   Objective: BP 108/70   Pulse 68   Wt 156 lb 9.6 oz (71 kg)   LMP 12/22/2016   SpO2 98%   BMI 30.58 kg/m   Gen: wd, wn, nad Abdomen: +bs, soft, nontender, no mass, no organomegaly Back: nontender Neck: supple,nontender, no mass, no thyromegaly   Assessment: Encounter Diagnoses  Name Primary?  . Missed period Yes  . Breast tenderness      Plan: Urine pregnancy negative.  Will check beta HcG discussed type of contraception.   She declines breast/pelvic exam.  She will make f/u appt with gynecology for pap and consideration of birth control options C/t condom use Advised she return soon for general physical and routine screening labs  Ashai was seen today for possible pregnacy.  Diagnoses and all orders for this visit:  Missed period -     POCT urine pregnancy -     hCG, quantitative, pregnancy  Breast tenderness

## 2017-01-29 ENCOUNTER — Telehealth: Payer: 59 | Admitting: Family

## 2017-01-29 DIAGNOSIS — J029 Acute pharyngitis, unspecified: Secondary | ICD-10-CM | POA: Diagnosis not present

## 2017-01-29 LAB — HCG, QUANTITATIVE, PREGNANCY: hCG, Beta Chain, Quant, S: <2 m[IU]/mL

## 2017-01-29 MED ORDER — BENZONATATE 100 MG PO CAPS: 100.0000 mg | ORAL_CAPSULE | Freq: Three times a day (TID) | ORAL | 0 refills | Status: DC | PRN

## 2017-01-29 NOTE — Progress Notes (Signed)
We are sorry that you are not feeling well.  Here is how we plan to help!  Based on what you have shared with me it looks like you have upper respiratory tract inflammation that has resulted in a significant cough.  Inflammation and infection in the upper respiratory tract is commonly called bronchitis and has four common causes:  Allergies, Viral Infections, Acid Reflux and Bacterial Infections.  Allergies, viruses and acid reflux are treated by controlling symptoms or eliminating the cause. An example might be a cough caused by taking certain blood pressure medications. You stop the cough by changing the medication. Another example might be a cough caused by acid reflux. Controlling the reflux helps control the cough.  Based on your presentation I believe you most likely have A cough due to a virus.  This is called viral bronchitis and is best treated by rest, plenty of fluids and control of the cough.  You may use Ibuprofen or Tylenol as directed to help your symptoms.     In addition you may use A non-prescription cough medication called Mucinex DM: take 2 tablets every 12 hours. and A prescription cough medication called Tessalon Perles 100mg . You may take 1-2 capsules every 8 hours as needed for your cough.   USE OF BRONCHODILATOR ("RESCUE") INHALERS: There is a risk from using your bronchodilator too frequently.  The risk is that over-reliance on a medication which only relaxes the muscles surrounding the breathing tubes can reduce the effectiveness of medications prescribed to reduce swelling and congestion of the tubes themselves.  Although you feel brief relief from the bronchodilator inhaler, your asthma may actually be worsening with the tubes becoming more swollen and filled with mucus.  This can delay other crucial treatments, such as oral steroid medications. If you need to use a bronchodilator inhaler daily, several times per day, you should discuss this with your provider.  There are  probably better treatments that could be used to keep your asthma under control.     HOME CARE . Only take medications as instructed by your medical team. . Complete the entire course of an antibiotic. . Drink plenty of fluids and get plenty of rest. . Avoid close contacts especially the very young and the elderly . Cover your mouth if you cough or cough into your sleeve. . Always remember to wash your hands . A steam or ultrasonic humidifier can help congestion.   GET HELP RIGHT AWAY IF: . You develop worsening fever. . You become short of breath . You cough up blood. . Your symptoms persist after you have completed your treatment plan MAKE SURE YOU   Understand these instructions.  Will watch your condition.  Will get help right away if you are not doing well or get worse.  Your e-visit answers were reviewed by a board certified advanced clinical practitioner to complete your personal care plan.  Depending on the condition, your plan could have included both over the counter or prescription medications. If there is a problem please reply  once you have received a response from your provider. Your safety is important to [place].  If you have drug allergies check your prescription carefully.    You can use MyChart to ask questions about today's visit, request a non-urgent call back, or ask for a work or school excuse for 24 hours related to this e-Visit. If it has been greater than 24 hours you will need to follow up with your provider, or enter a new  e-Visit to address those concerns. You will get an e-mail in the next two days asking about your experience.  I hope that your e-visit has been valuable and will speed your recovery. Thank you for using e-visits.

## 2017-02-03 ENCOUNTER — Telehealth: Payer: 59 | Admitting: Nurse Practitioner

## 2017-02-03 DIAGNOSIS — J209 Acute bronchitis, unspecified: Secondary | ICD-10-CM

## 2017-02-03 MED ORDER — ALBUTEROL SULFATE HFA 108 (90 BASE) MCG/ACT IN AERS: 2.0000 | INHALATION_SPRAY | Freq: Four times a day (QID) | RESPIRATORY_TRACT | 0 refills | Status: DC | PRN

## 2017-02-03 MED ORDER — PREDNISONE 10 MG PO TABS: 10.0000 mg | ORAL_TABLET | Freq: Every day | ORAL | 0 refills | Status: AC

## 2017-02-03 MED FILL — VENTOLIN HFA 90 MCG INHALER: 108 (90 BAS | 25 days supply | Qty: 18 | Fill #0

## 2017-02-03 MED FILL — predniSONE 10 MG TABS: 10 | 10 days supply | Qty: 10 | Fill #0

## 2017-02-03 NOTE — Progress Notes (Signed)
We are sorry that you are not feeling well.  Here is how we plan to help!  Based on what you have shared with me it looks like you have upper respiratory tract inflammation that has resulted in a significant cough.  Inflammation and infection in the upper respiratory tract is commonly called bronchitis and has four common causes:  Allergies, Viral Infections, Acid Reflux and Bacterial Infections.  Allergies, viruses and acid reflux are treated by controlling symptoms or eliminating the cause. An example might be a cough caused by taking certain blood pressure medications. You stop the cough by changing the medication. Another example might be a cough caused by acid reflux. Controlling the reflux helps control the cough.  Based on your presentation I believe you most likely have A cough due to a virus.  This is called viral bronchitis and is best treated by rest, plenty of fluids and control of the cough.  You may use Ibuprofen or Tylenol as directed to help your symptoms.     In addition you may use A prescription cough medication called Tessalon Perles 100mg . You may take 1-2 capsules every 8 hours as needed for your cough.  Sterapred 10 mg dosepak  USE OF BRONCHODILATOR ("RESCUE") INHALERS: There is a risk from using your bronchodilator too frequently.  The risk is that over-reliance on a medication which only relaxes the muscles surrounding the breathing tubes can reduce the effectiveness of medications prescribed to reduce swelling and congestion of the tubes themselves.  Although you feel brief relief from the bronchodilator inhaler, your condition may actually be worsening with the tubes becoming more swollen and filled with mucus.  This can delay other crucial treatments, such as oral steroid medications. If you need to use a bronchodilator inhaler daily, several times per day, you should discuss this with your provider.  I am also prescribing an Albuterol inhaler, but only use when short of breath  or as a rescue option.    HOME CARE . Only take medications as instructed by your medical team. . Complete the entire course of an antibiotic. . Drink plenty of fluids and get plenty of rest. . Avoid close contacts especially the very young and the elderly . Cover your mouth if you cough or cough into your sleeve. . Always remember to wash your hands . A steam or ultrasonic humidifier can help congestion.   GET HELP RIGHT AWAY IF: . You develop worsening fever. . You become short of breath . You cough up blood. . Your symptoms persist after you have completed your treatment plan MAKE SURE YOU   Understand these instructions.  Will watch your condition.  Will get help right away if you are not doing well or get worse.  Your e-visit answers were reviewed by a board certified advanced clinical practitioner to complete your personal care plan.  Depending on the condition, your plan could have included both over the counter or prescription medications. If there is a problem please reply  once you have received a response from your provider. Your safety is important to [place].  If you have drug allergies check your prescription carefully.    You can use MyChart to ask questions about today's visit, request a non-urgent call back, or ask for a work or school excuse for 24 hours related to this e-Visit. If it has been greater than 24 hours you will need to follow up with your provider, or enter a new e-Visit to address those concerns. You will  get an e-mail in the next two days asking about your experience.  I hope that your e-visit has been valuable and will speed your recovery. Thank you for using e-visits.

## 2017-03-15 DIAGNOSIS — L700 Acne vulgaris: Secondary | ICD-10-CM | POA: Diagnosis not present

## 2017-05-26 ENCOUNTER — Ambulatory Visit (INDEPENDENT_AMBULATORY_CARE_PROVIDER_SITE_OTHER): Payer: 59 | Admitting: Emergency Medicine

## 2017-05-26 ENCOUNTER — Encounter: Payer: Self-pay | Admitting: Emergency Medicine

## 2017-05-26 VITALS — BP 103/70 | HR 74 | Temp 98.2°F | Resp 16 | Ht 60.5 in | Wt 156.2 lb

## 2017-05-26 DIAGNOSIS — Z0000 Encounter for general adult medical examination without abnormal findings: Secondary | ICD-10-CM | POA: Diagnosis not present

## 2017-05-26 NOTE — Progress Notes (Signed)
Kimberly Cooper 26 y.o.   Chief Complaint  Patient presents with  . Annual Exam    new patient establishing care    HISTORY OF PRESENT ILLNESS: This is a 26 y.o. female here for annual exam; last GYN visit last year. Has no complaints or medical concerns. Last PAP at [facility] done last year per patient.  HPI   Prior to Admission medications   Medication Sig Start Date End Date Taking? Authorizing Provider  albuterol (PROVENTIL HFA;VENTOLIN HFA) 108 (90 Base) MCG/ACT inhaler Inhale 2 puffs into the lungs every 6 (six) hours as needed for wheezing or shortness of breath. 02/03/17 02/13/17  Benay Pike, NP  benzonatate (TESSALON PERLES) 100 MG capsule Take 1-2 capsules (100-200 mg total) by mouth every 8 (eight) hours as needed for cough. Patient not taking: Reported on 05/26/2017 01/29/17   Beau Fanny, FNP  Dexlansoprazole (DEXILANT) 30 MG capsule Take 1 capsule (30 mg total) by mouth daily. Patient not taking: Reported on 01/28/2017 11/30/16   Tysinger, Kermit Balo, PA-C  omeprazole (PRILOSEC) 40 MG capsule Take 1 capsule (40 mg total) by mouth daily. Patient not taking: Reported on 01/28/2017 11/30/16   Jac Canavan, PA-C    Allergies  Allergen Reactions  . Other Hives    All Raw Nuts: Swelling  . Penicillins Hives    Patient Active Problem List   Diagnosis Date Noted  . Pelvic pain in female 07/17/2016  . Dyspareunia, female 07/17/2016  . Loose stools 07/17/2016  . Dizziness and giddiness 07/17/2016  . Dysmenorrhea 08/23/2012    Past Medical History:  Diagnosis Date  . Allergy   . Chlamydia   . GERD (gastroesophageal reflux disease)   . Gonorrhea   . MRSA infection greater than 3 months ago 2003   ABDOMINAL CYST    History reviewed. No pertinent surgical history.  Social History   Social History  . Marital status: Single    Spouse name: N/A  . Number of children: N/A  . Years of education: N/A   Occupational History  . Not on file.    Social History Main Topics  . Smoking status: Never Smoker  . Smokeless tobacco: Never Used  . Alcohol use 1.2 oz/week    1 Glasses of wine, 1 Cans of beer per week  . Drug use: No  . Sexual activity: Yes    Birth control/ protection: None   Other Topics Concern  . Not on file   Social History Narrative  . No narrative on file    Family History  Problem Relation Age of Onset  . Hypertension Mother   . Heart disease Father 40       MI  . Other Father        gunshot, paralysis  . Cancer Maternal Grandfather        lung/prostate  . Cancer Other        GGM has colon cancer, other great aunts and uncles with cancer  . Stroke Maternal Grandmother      Review of Systems  Constitutional: Negative.  Negative for chills, fever, malaise/fatigue and weight loss.  HENT: Negative.  Negative for congestion, nosebleeds, sinus pain and sore throat.   Eyes: Negative.  Negative for blurred vision, double vision, discharge and redness.  Respiratory: Negative.  Negative for cough, hemoptysis and shortness of breath.   Cardiovascular: Negative.  Negative for chest pain, palpitations and leg swelling.  Gastrointestinal: Negative.  Negative for abdominal pain, blood in  stool, diarrhea, nausea and vomiting.  Genitourinary: Negative.  Negative for dysuria and hematuria.  Musculoskeletal: Negative for back pain, myalgias and neck pain.  Skin: Negative.  Negative for rash.  Neurological: Negative.  Negative for dizziness and headaches.  Endo/Heme/Allergies: Negative.   All other systems reviewed and are negative.  Vitals:   05/26/17 0814  BP: 103/70  Pulse: 74  Resp: 16  Temp: 98.2 F (36.8 C)     Physical Exam  Constitutional: She is oriented to person, place, and time. She appears well-developed and well-nourished.  HENT:  Head: Normocephalic and atraumatic.  Mouth/Throat: Oropharynx is clear and moist. No oropharyngeal exudate.  Eyes: Conjunctivae and EOM are normal. Pupils are  equal, round, and reactive to light.  Neck: Normal range of motion. Neck supple. No JVD present. No thyromegaly present.  Cardiovascular: Normal rate, regular rhythm, normal heart sounds and intact distal pulses.   Pulmonary/Chest: Effort normal and breath sounds normal.  Abdominal: Soft. She exhibits no distension. There is no tenderness.  Musculoskeletal: Normal range of motion.  Lymphadenopathy:    She has no cervical adenopathy.  Neurological: She is alert and oriented to person, place, and time. No sensory deficit. She exhibits normal muscle tone.  Skin: Skin is warm and dry. Capillary refill takes less than 2 seconds. No rash noted.  Psychiatric: She has a normal mood and affect. Her behavior is normal.  Vitals reviewed.    ASSESSMENT & PLAN: [name] was seen today for annual exam.  Diagnoses and all orders for this visit:  Routine general medical examination at a health care facility -     CBC with Differential -     Comprehensive metabolic panel -     Hemoglobin A1c -     Lipid panel -     TSH -     HIV antibody   Patient Instructions       IF you received an x-ray today, you will receive an invoice from [facility]. Please contact [facility] at [phone] with questions or concerns regarding your invoice.   IF you received labwork today, you will receive an invoice from [facility]. Please contact [facility] at [phone] with questions or concerns regarding your invoice.   Our billing staff will not be able to assist you with questions regarding bills from these companies.  You will be contacted with the lab results as soon as they are available. The fastest way to get your results is to activate your My Chart account. Instructions are located on the last page of this paperwork. If you have not heard from [place] regarding the results in 2 weeks, please contact this office.      Health Maintenance, Female Adopting a healthy lifestyle and getting  preventive care can go a long way to promote health and wellness. Talk with your health care provider about what schedule of regular examinations is right for you. This is a good chance for you to check in with your provider about disease prevention and staying healthy. In between checkups, there are plenty of things you can do on your own. Experts have done a lot of research about which lifestyle changes and preventive measures are most likely to keep you healthy. Ask your health care provider for more information. Weight and diet Eat a healthy diet  Be sure to include plenty of vegetables, fruits, low-fat dairy products, and lean protein.  Do not eat a lot of foods high in solid fats, added sugars, or salt.  Get  regular exercise. This is one of the most important things you can do for your health. ? Most adults should exercise for at least 150 minutes each week. The exercise should increase your heart rate and make you sweat (moderate-intensity exercise). ? Most adults should also do strengthening exercises at least twice a week. This is in addition to the moderate-intensity exercise.  Maintain a healthy weight  Body mass index (BMI) is a measurement that can be used to identify possible weight problems. It estimates body fat based on height and weight. Your health care provider can help determine your BMI and help you achieve or maintain a healthy weight.  For females 17 years of age and older: ? A BMI below 18.5 is considered underweight. ? A BMI of 18.5 to 24.9 is normal. ? A BMI of 25 to 29.9 is considered overweight. ? A BMI of 30 and above is considered obese.  Watch levels of cholesterol and blood lipids  You should start having your blood tested for lipids and cholesterol at 26 years of age, then have this test every 5 years.  You may need to have your cholesterol levels checked more often if: ? Your lipid or cholesterol levels are high. ? You are older than 26 years of  age. ? You are at high risk for heart disease.  Cancer screening Lung Cancer  Lung cancer screening is recommended for adults 70-88 years old who are at high risk for lung cancer because of a history of smoking.  A yearly low-dose CT scan of the lungs is recommended for people who: ? Currently smoke. ? Have quit within the past 15 years. ? Have at least a 30-pack-year history of smoking. A pack year is smoking an average of one pack of cigarettes a day for 1 year.  Yearly screening should continue until it has been 15 years since you quit.  Yearly screening should stop if you develop a health problem that would prevent you from having lung cancer treatment.  Breast Cancer  Practice breast self-awareness. This means understanding how your breasts normally appear and feel.  It also means doing regular breast self-exams. Let your health care provider know about any changes, no matter how small.  If you are in your 20s or 30s, you should have a clinical breast exam (CBE) by a health care provider every 1-3 years as part of a regular health exam.  If you are 47 or older, have a CBE every year. Also consider having a breast X-ray (mammogram) every year.  If you have a family history of breast cancer, talk to your health care provider about genetic screening.  If you are at high risk for breast cancer, talk to your health care provider about having an MRI and a mammogram every year.  Breast cancer gene (BRCA) assessment is recommended for women who have family members with BRCA-related cancers. BRCA-related cancers include: ? Breast. ? Ovarian. ? Tubal. ? Peritoneal cancers.  Results of the assessment will determine the need for genetic counseling and BRCA1 and BRCA2 testing.  Cervical Cancer Your health care provider may recommend that you be screened regularly for cancer of the pelvic organs (ovaries, uterus, and vagina). This screening involves a pelvic examination, including  checking for microscopic changes to the surface of your cervix (Pap test). You may be encouraged to have this screening done every 3 years, beginning at age 57.  For women ages 36-65, health care providers may recommend pelvic exams and Pap testing  every 3 years, or they may recommend the Pap and pelvic exam, combined with testing for human papilloma virus (HPV), every 5 years. Some types of HPV increase your risk of cervical cancer. Testing for HPV may also be done on women of any age with unclear Pap test results.  Other health care providers may not recommend any screening for nonpregnant women who are considered low risk for pelvic cancer and who do not have symptoms. Ask your health care provider if a screening pelvic exam is right for you.  If you have had past treatment for cervical cancer or a condition that could lead to cancer, you need Pap tests and screening for cancer for at least 20 years after your treatment. If Pap tests have been discontinued, your risk factors (such as having a new sexual partner) need to be reassessed to determine if screening should resume. Some women have medical problems that increase the chance of getting cervical cancer. In these cases, your health care provider may recommend more frequent screening and Pap tests.  Colorectal Cancer  This type of cancer can be detected and often prevented.  Routine colorectal cancer screening usually begins at 26 years of age and continues through 26 years of age.  Your health care provider may recommend screening at an earlier age if you have risk factors for colon cancer.  Your health care provider may also recommend using home test kits to check for hidden blood in the stool.  A small camera at the end of a tube can be used to examine your colon directly (sigmoidoscopy or colonoscopy). This is done to check for the earliest forms of colorectal cancer.  Routine screening usually begins at age 9.  Direct examination of  the colon should be repeated every 5-10 years through 26 years of age. However, you may need to be screened more often if early forms of precancerous polyps or small growths are found.  Skin Cancer  Check your skin from head to toe regularly.  Tell your health care provider about any new moles or changes in moles, especially if there is a change in a mole's shape or color.  Also tell your health care provider if you have a mole that is larger than the size of a pencil eraser.  Always use sunscreen. Apply sunscreen liberally and repeatedly throughout the day.  Protect yourself by wearing long sleeves, pants, a wide-brimmed hat, and sunglasses whenever you are outside.  Heart disease, diabetes, and high blood pressure  High blood pressure causes heart disease and increases the risk of stroke. High blood pressure is more likely to develop in: ? People who have blood pressure in the high end of the normal range (130-139/85-89 mm Hg). ? People who are overweight or obese. ? People who are African American.  If you are 42-34 years of age, have your blood pressure checked every 3-5 years. If you are 52 years of age or older, have your blood pressure checked every year. You should have your blood pressure measured twice-once when you are at a hospital or clinic, and once when you are not at a hospital or clinic. Record the average of the two measurements. To check your blood pressure when you are not at a hospital or clinic, you can use: ? An automated blood pressure machine at a pharmacy. ? A home blood pressure monitor.  If you are between 47 years and 34 years old, ask your health care provider if you should take aspirin to  prevent strokes.  Have regular diabetes screenings. This involves taking a blood sample to check your fasting blood sugar level. ? If you are at a normal weight and have a low risk for diabetes, have this test once every three years after 26 years of age. ? If you are  overweight and have a high risk for diabetes, consider being tested at a younger age or more often. Preventing infection Hepatitis B  If you have a higher risk for hepatitis B, you should be screened for this virus. You are considered at high risk for hepatitis B if: ? You were born in a country where hepatitis B is common. Ask your health care provider which countries are considered high risk. ? Your parents were born in a high-risk country, and you have not been immunized against hepatitis B (hepatitis B vaccine). ? You have HIV or AIDS. ? You use needles to inject street drugs. ? You live with someone who has hepatitis B. ? You have had sex with someone who has hepatitis B. ? You get hemodialysis treatment. ? You take certain medicines for conditions, including cancer, organ transplantation, and autoimmune conditions.  Hepatitis C  Blood testing is recommended for: ? Everyone born from [birth year] through 1965. ? Anyone with known risk factors for hepatitis C.  Sexually transmitted infections (STIs)  You should be screened for sexually transmitted infections (STIs) including gonorrhea and chlamydia if: ? You are sexually active and are younger than 26 years of age. ? You are older than 26 years of age and your health care provider tells you that you are at risk for this type of infection. ? Your sexual activity has changed since you were last screened and you are at an increased risk for chlamydia or gonorrhea. Ask your health care provider if you are at risk.  If you do not have HIV, but are at risk, it may be recommended that you take a prescription medicine daily to prevent HIV infection. This is called pre-exposure prophylaxis (PrEP). You are considered at risk if: ? You are sexually active and do not regularly use condoms or know the HIV status of your partner(s). ? You take drugs by injection. ? You are sexually active with a partner who has HIV.  Talk with your health care provider  about whether you are at high risk of being infected with HIV. If you choose to begin PrEP, you should first be tested for HIV. You should then be tested every 3 months for as long as you are taking PrEP. Pregnancy  If you are premenopausal and you may become pregnant, ask your health care provider about preconception counseling.  If you may become pregnant, take 400 to 800 micrograms (mcg) of folic acid every day.  If you want to prevent pregnancy, talk to your health care provider about birth control (contraception). Osteoporosis and menopause  Osteoporosis is a disease in which the bones lose minerals and strength with aging. This can result in serious bone fractures. Your risk for osteoporosis can be identified using a bone density scan.  If you are 26 years of age or older, or if you are at risk for osteoporosis and fractures, ask your health care provider if you should be screened.  Ask your health care provider whether you should take a calcium or vitamin D supplement to lower your risk for osteoporosis.  Menopause may have certain physical symptoms and risks.  Hormone replacement therapy may reduce some of these symptoms and risks.  Talk to your health care provider about whether hormone replacement therapy is right for you. Follow these instructions at home:  Schedule regular health, dental, and eye exams.  Stay current with your immunizations.  Do not use any tobacco products including cigarettes, chewing tobacco, or electronic cigarettes.  If you are pregnant, do not drink alcohol.  If you are breastfeeding, limit how much and how often you drink alcohol.  Limit alcohol intake to no more than 1 drink per day for nonpregnant women. One drink equals 12 ounces of beer, 5 ounces of wine, or 1 ounces of hard liquor.  Do not use street drugs.  Do not share needles.  Ask your health care provider for help if you need support or information about quitting drugs.  Tell your  health care provider if you often feel depressed.  Tell your health care provider if you have ever been abused or do not feel safe at home. This information is not intended to replace advice given to you by your health care provider. Make sure you discuss any questions you have with your health care provider. Document Released: 06/22/2011 Document Revised: 05/14/2016 Document Reviewed: 09/10/2015 Elsevier Interactive Patient Education  2018 [place] (AHA) Exercise Recommendation  Being physically active is important to prevent heart disease and stroke, the nation's No. 1and No. 5killers. To improve overall cardiovascular health, we suggest at least 150 minutes per week of moderate exercise or 75 minutes per week of vigorous exercise (or a combination of moderate and vigorous activity). Thirty minutes a day, five times a week is an easy goal to remember. You will also experience benefits even if you divide your time into two or three segments of 10 to 15 minutes per day.  For people who would benefit from lowering their blood pressure or cholesterol, we recommend 40 minutes of aerobic exercise of moderate to vigorous intensity three to four times a week to lower the risk for heart attack and stroke.  Physical activity is anything that makes you move your body and burn calories.  This includes things like climbing stairs or playing sports. Aerobic exercises benefit your heart, and include walking, jogging, swimming or biking. Strength and stretching exercises are best for overall stamina and flexibility.  The simplest, positive change you can make to effectively improve your heart health is to start walking. It's enjoyable, free, easy, social and great exercise. A walking program is flexible and boasts high success rates because people can stick with it. It's easy for walking to become a regular and satisfying part of life.   For Overall Cardiovascular Health:  At  least 30 minutes of moderate-intensity aerobic activity at least 5 days per week for a total of 150  OR   At least 25 minutes of vigorous aerobic activity at least 3 days per week for a total of 75 minutes; or a combination of moderate- and vigorous-intensity aerobic activity  AND   Moderate- to high-intensity muscle-strengthening activity at least 2 days per week for additional health benefits.  For Lowering Blood Pressure and Cholesterol  An average 40 minutes of moderate- to vigorous-intensity aerobic activity 3 or 4 times per week  What if I can't make it to the time goal? Something is always better than nothing! And everyone has to start somewhere. Even if you've been sedentary for years, today is the day you can begin to make healthy changes in your life. If you don't think you'll make it for 30  or 40 minutes, set a reachable goal for today. You can work up toward your overall goal by increasing your time as you get stronger. Don't let all-or-nothing thinking rob you of doing what you can every day.  Source:[email or web address], MD [facility]

## 2017-05-26 NOTE — Patient Instructions (Addendum)
   IF you received an x-ray today, you will receive an invoice from [facility]. Please contact  Radiology at [phone] with questions or concerns regarding your invoice.   IF you received labwork today, you will receive an invoice from [facility]. Please contact [facility] at [phone] with questions or concerns regarding your invoice.   Our billing staff will not be able to assist you with questions regarding bills from these companies.  You will be contacted with the lab results as soon as they are available. The fastest way to get your results is to activate your My Chart account. Instructions are located on the last page of this paperwork. If you have not heard from us regarding the results in 2 weeks, please contact this office.     Health Maintenance, Female Adopting a healthy lifestyle and getting preventive care can go a long way to promote health and wellness. Talk with your health care provider about what schedule of regular examinations is right for you. This is a good chance for you to check in with your provider about disease prevention and staying healthy. In between checkups, there are plenty of things you can do on your own. Experts have done a lot of research about which lifestyle changes and preventive measures are most likely to keep you healthy. Ask your health care provider for more information. Weight and diet Eat a healthy diet  Be sure to include plenty of vegetables, fruits, low-fat dairy products, and lean protein.  Do not eat a lot of foods high in solid fats, added sugars, or salt.  Get regular exercise. This is one of the most important things you can do for your health. ? Most adults should exercise for at least 150 minutes each week. The exercise should increase your heart rate and make you sweat (moderate-intensity exercise). ? Most adults should also do strengthening exercises at least twice a week. This is in addition to the  moderate-intensity exercise.  Maintain a healthy weight  Body mass index (BMI) is a measurement that can be used to identify possible weight problems. It estimates body fat based on height and weight. Your health care provider can help determine your BMI and help you achieve or maintain a healthy weight.  For females 20 years of age and older: ? A BMI below 18.5 is considered underweight. ? A BMI of 18.5 to 24.9 is normal. ? A BMI of 25 to 29.9 is considered overweight. ? A BMI of 30 and above is considered obese.  Watch levels of cholesterol and blood lipids  You should start having your blood tested for lipids and cholesterol at 26 years of age, then have this test every 5 years.  You may need to have your cholesterol levels checked more often if: ? Your lipid or cholesterol levels are high. ? You are older than 26 years of age. ? You are at high risk for heart disease.  Cancer screening Lung Cancer  Lung cancer screening is recommended for adults 55-80 years old who are at high risk for lung cancer because of a history of smoking.  A yearly low-dose CT scan of the lungs is recommended for people who: ? Currently smoke. ? Have quit within the past 15 years. ? Have at least a 30-pack-year history of smoking. A pack year is smoking an average of one pack of cigarettes a day for 1 year.  Yearly screening should continue until it has been 15 years since you quit.  Yearly   screening should stop if you develop a health problem that would prevent you from having lung cancer treatment.  Breast Cancer  Practice breast self-awareness. This means understanding how your breasts normally appear and feel.  It also means doing regular breast self-exams. Let your health care provider know about any changes, no matter how small.  If you are in your 20s or 30s, you should have a clinical breast exam (CBE) by a health care provider every 1-3 years as part of a regular health exam.  If you  are 40 or older, have a CBE every year. Also consider having a breast X-ray (mammogram) every year.  If you have a family history of breast cancer, talk to your health care provider about genetic screening.  If you are at high risk for breast cancer, talk to your health care provider about having an MRI and a mammogram every year.  Breast cancer gene (BRCA) assessment is recommended for women who have family members with BRCA-related cancers. BRCA-related cancers include: ? Breast. ? Ovarian. ? Tubal. ? Peritoneal cancers.  Results of the assessment will determine the need for genetic counseling and BRCA1 and BRCA2 testing.  Cervical Cancer Your health care provider may recommend that you be screened regularly for cancer of the pelvic organs (ovaries, uterus, and vagina). This screening involves a pelvic examination, including checking for microscopic changes to the surface of your cervix (Pap test). You may be encouraged to have this screening done every 3 years, beginning at age 21.  For women ages 30-65, health care providers may recommend pelvic exams and Pap testing every 3 years, or they may recommend the Pap and pelvic exam, combined with testing for human papilloma virus (HPV), every 5 years. Some types of HPV increase your risk of cervical cancer. Testing for HPV may also be done on women of any age with unclear Pap test results.  Other health care providers may not recommend any screening for nonpregnant women who are considered low risk for pelvic cancer and who do not have symptoms. Ask your health care provider if a screening pelvic exam is right for you.  If you have had past treatment for cervical cancer or a condition that could lead to cancer, you need Pap tests and screening for cancer for at least 20 years after your treatment. If Pap tests have been discontinued, your risk factors (such as having a new sexual partner) need to be reassessed to determine if screening should  resume. Some women have medical problems that increase the chance of getting cervical cancer. In these cases, your health care provider may recommend more frequent screening and Pap tests.  Colorectal Cancer  This type of cancer can be detected and often prevented.  Routine colorectal cancer screening usually begins at 26 years of age and continues through 26 years of age.  Your health care provider may recommend screening at an earlier age if you have risk factors for colon cancer.  Your health care provider may also recommend using home test kits to check for hidden blood in the stool.  A small camera at the end of a tube can be used to examine your colon directly (sigmoidoscopy or colonoscopy). This is done to check for the earliest forms of colorectal cancer.  Routine screening usually begins at age 50.  Direct examination of the colon should be repeated every 5-10 years through 26 years of age. However, you may need to be screened more often if early forms of precancerous polyps   or small growths are found.  Skin Cancer  Check your skin from head to toe regularly.  Tell your health care provider about any new moles or changes in moles, especially if there is a change in a mole's shape or color.  Also tell your health care provider if you have a mole that is larger than the size of a pencil eraser.  Always use sunscreen. Apply sunscreen liberally and repeatedly throughout the day.  Protect yourself by wearing long sleeves, pants, a wide-brimmed hat, and sunglasses whenever you are outside.  Heart disease, diabetes, and high blood pressure  High blood pressure causes heart disease and increases the risk of stroke. High blood pressure is more likely to develop in: ? People who have blood pressure in the high end of the normal range (130-139/85-89 mm Hg). ? People who are overweight or obese. ? People who are African American.  If you are 18-39 years of age, have your blood  pressure checked every 3-5 years. If you are 40 years of age or older, have your blood pressure checked every year. You should have your blood pressure measured twice-once when you are at a hospital or clinic, and once when you are not at a hospital or clinic. Record the average of the two measurements. To check your blood pressure when you are not at a hospital or clinic, you can use: ? An automated blood pressure machine at a pharmacy. ? A home blood pressure monitor.  If you are between 55 years and 79 years old, ask your health care provider if you should take aspirin to prevent strokes.  Have regular diabetes screenings. This involves taking a blood sample to check your fasting blood sugar level. ? If you are at a normal weight and have a low risk for diabetes, have this test once every three years after 26 years of age. ? If you are overweight and have a high risk for diabetes, consider being tested at a younger age or more often. Preventing infection Hepatitis B  If you have a higher risk for hepatitis B, you should be screened for this virus. You are considered at high risk for hepatitis B if: ? You were born in a country where hepatitis B is common. Ask your health care provider which countries are considered high risk. ? Your parents were born in a high-risk country, and you have not been immunized against hepatitis B (hepatitis B vaccine). ? You have HIV or AIDS. ? You use needles to inject street drugs. ? You live with someone who has hepatitis B. ? You have had sex with someone who has hepatitis B. ? You get hemodialysis treatment. ? You take certain medicines for conditions, including cancer, organ transplantation, and autoimmune conditions.  Hepatitis C  Blood testing is recommended for: ? Everyone born from 1945 through 1965. ? Anyone with known risk factors for hepatitis C.  Sexually transmitted infections (STIs)  You should be screened for sexually transmitted  infections (STIs) including gonorrhea and chlamydia if: ? You are sexually active and are younger than 26 years of age. ? You are older than 26 years of age and your health care provider tells you that you are at risk for this type of infection. ? Your sexual activity has changed since you were last screened and you are at an increased risk for chlamydia or gonorrhea. Ask your health care provider if you are at risk.  If you do not have HIV, but are at risk,   risk, it may be recommended that you take a prescription medicine daily to prevent HIV infection. This is called pre-exposure prophylaxis (PrEP). You are considered at risk if: ? You are sexually active and do not regularly use condoms or know the HIV status of your partner(s). ? You take drugs by injection. ? You are sexually active with a partner who has HIV.  Talk with your health care provider about whether you are at high risk of being infected with HIV. If you choose to begin PrEP, you should first be tested for HIV. You should then be tested every 3 months for as long as you are taking PrEP. Pregnancy  If you are premenopausal and you may become pregnant, ask your health care provider about preconception counseling.  If you may become pregnant, take 400 to 800 micrograms (mcg) of folic acid every day.  If you want to prevent pregnancy, talk to your health care provider about birth control (contraception). Osteoporosis and menopause  Osteoporosis is a disease in which the bones lose minerals and strength with aging. This can result in serious bone fractures. Your risk for osteoporosis can be identified using a bone density scan.  If you are 70 years of age or older, or if you are at risk for osteoporosis and fractures, ask your health care provider if you should be screened.  Ask your health care provider whether you should take a calcium or vitamin D supplement to lower your risk for osteoporosis.  Menopause may have certain physical  symptoms and risks.  Hormone replacement therapy may reduce some of these symptoms and risks. Talk to your health care provider about whether hormone replacement therapy is right for you. Follow these instructions at home:  Schedule regular health, dental, and eye exams.  Stay current with your immunizations.  Do not use any tobacco products including cigarettes, chewing tobacco, or electronic cigarettes.  If you are pregnant, do not drink alcohol.  If you are breastfeeding, limit how much and how often you drink alcohol.  Limit alcohol intake to no more than 1 drink per day for nonpregnant women. One drink equals 12 ounces of beer, 5 ounces of wine, or 1 ounces of hard liquor.  Do not use street drugs.  Do not share needles.  Ask your health care provider for help if you need support or information about quitting drugs.  Tell your health care provider if you often feel depressed.  Tell your health care provider if you have ever been abused or do not feel safe at home. This information is not intended to replace advice given to you by your health care provider. Make sure you discuss any questions you have with your health care provider. Document Released: 06/22/2011 Document Revised: 05/14/2016 Document Reviewed: 09/10/2015 Elsevier Interactive Patient Education  2018 [place] (AHA) Exercise Recommendation  Being physically active is important to prevent heart disease and stroke, the nation's No. 1and No. 5killers. To improve overall cardiovascular health, we suggest at least 150 minutes per week of moderate exercise or 75 minutes per week of vigorous exercise (or a combination of moderate and vigorous activity). Thirty minutes a day, five times a week is an easy goal to remember. You will also experience benefits even if you divide your time into two or three segments of 10 to 15 minutes per day.  For people who would benefit from lowering their  blood pressure or cholesterol, we recommend 40 minutes of aerobic exercise of moderate  vigorous intensity three to four times a week to lower the risk for heart attack and stroke.  Physical activity is anything that makes you move your body and burn calories.  This includes things like climbing stairs or playing sports. Aerobic exercises benefit your heart, and include walking, jogging, swimming or biking. Strength and stretching exercises are best for overall stamina and flexibility.  The simplest, positive change you can make to effectively improve your heart health is to start walking. It's enjoyable, free, easy, social and great exercise. A walking program is flexible and boasts high success rates because people can stick with it. It's easy for walking to become a regular and satisfying part of life.   For Overall Cardiovascular Health:  At least 30 minutes of moderate-intensity aerobic activity at least 5 days per week for a total of 150  OR   At least 25 minutes of vigorous aerobic activity at least 3 days per week for a total of 75 minutes; or a combination of moderate- and vigorous-intensity aerobic activity  AND   Moderate- to high-intensity muscle-strengthening activity at least 2 days per week for additional health benefits.  For Lowering Blood Pressure and Cholesterol  An average 40 minutes of moderate- to vigorous-intensity aerobic activity 3 or 4 times per week  What if I can't make it to the time goal? Something is always better than nothing! And everyone has to start somewhere. Even if you've been sedentary for years, today is the day you can begin to make healthy changes in your life. If you don't think you'll make it for 30 or 40 minutes, set a reachable goal for today. You can work up toward your overall goal by increasing your time as you get stronger. Don't let all-or-nothing thinking rob you of doing what you can every day.  Source:http://www.heart.org    

## 2017-05-27 ENCOUNTER — Encounter: Payer: 59 | Admitting: Medical

## 2017-05-27 LAB — COMPREHENSIVE METABOLIC PANEL
ALK PHOS: 59 IU/L (ref 39–117)
ALT: 9 IU/L (ref 0–32)
AST: 14 IU/L (ref 0–40)
Albumin/Globulin Ratio: 1.5 (ref 1.2–2.2)
Albumin: 4.4 g/dL (ref 3.5–5.5)
BUN/Creatinine Ratio: 16 (ref 9–23)
BUN: 9 mg/dL (ref 6–20)
Bilirubin Total: 0.3 mg/dL (ref 0.0–1.2)
CO2: 23 mmol/L (ref 18–29)
Calcium: 9.2 mg/dL (ref 8.7–10.2)
Chloride: 106 mmol/L (ref 96–106)
Creatinine, Ser: 0.56 mg/dL — ABNORMAL LOW (ref 0.57–1.00)
GFR calc Af Amer: 149 mL/min/{1.73_m2} (ref >59)
GFR calc non Af Amer: 129 mL/min/{1.73_m2} (ref >59)
GLOBULIN, TOTAL: 3 g/dL (ref 1.5–4.5)
Glucose: 82 mg/dL (ref 65–99)
POTASSIUM: 4.1 mmol/L (ref 3.5–5.2)
SODIUM: 142 mmol/L (ref 134–144)
Total Protein: 7.4 g/dL (ref 6.0–8.5)

## 2017-05-27 LAB — HEMOGLOBIN A1C
ESTIMATED AVERAGE GLUCOSE: 85 mg/dL
HEMOGLOBIN A1C: 4.6 % — AB (ref 4.8–5.6)

## 2017-05-27 LAB — CBC WITH DIFFERENTIAL/PLATELET
BASOS ABS: 0 10*3/uL (ref 0.0–0.2)
Basos: 0 %
EOS (ABSOLUTE): 0.2 10*3/uL (ref 0.0–0.4)
EOS: 3 %
HEMATOCRIT: 38.1 % (ref 34.0–46.6)
Hemoglobin: 12.5 g/dL (ref 11.1–15.9)
Immature Grans (Abs): 0 10*3/uL (ref 0.0–0.1)
Immature Granulocytes: 0 %
LYMPHS ABS: 1.8 10*3/uL (ref 0.7–3.1)
Lymphs: 26 %
MCH: 29.5 pg (ref 26.6–33.0)
MCHC: 32.8 g/dL (ref 31.5–35.7)
MCV: 90 fL (ref 79–97)
MONOS ABS: 0.4 10*3/uL (ref 0.1–0.9)
Monocytes: 6 %
Neutrophils Absolute: 4.5 10*3/uL (ref 1.4–7.0)
Neutrophils: 65 %
PLATELETS: 246 10*3/uL (ref 150–379)
RBC: 4.24 x10E6/uL (ref 3.77–5.28)
RDW: 13.2 % (ref 12.3–15.4)
WBC: 7 10*3/uL (ref 3.4–10.8)

## 2017-05-27 LAB — LIPID PANEL
CHOLESTEROL TOTAL: 152 mg/dL (ref 100–199)
Chol/HDL Ratio: 2.6 ratio (ref 0.0–4.4)
HDL: 58 mg/dL (ref >39)
LDL Calculated: 86 mg/dL (ref 0–99)
Triglycerides: 39 mg/dL (ref 0–149)
VLDL CHOLESTEROL CAL: 8 mg/dL (ref 5–40)

## 2017-05-27 LAB — TSH: TSH: 1.75 u[IU]/mL (ref 0.450–4.500)

## 2017-05-27 LAB — HIV ANTIBODY (ROUTINE TESTING W REFLEX): HIV Screen 4th Generation wRfx: NONREACTIVE

## 2017-07-01 ENCOUNTER — Telehealth: Payer: 59 | Admitting: Physician Assistant

## 2017-07-01 DIAGNOSIS — B001 Herpesviral vesicular dermatitis: Secondary | ICD-10-CM | POA: Diagnosis not present

## 2017-07-01 MED ORDER — VALACYCLOVIR HCL 1 G PO TABS: ORAL_TABLET | ORAL | 0 refills | Status: DC

## 2017-07-01 NOTE — Progress Notes (Signed)
We are sorry that you are not feeling well.  Here is how we plan to help!  Based on what you have shared with me it does look like you have a viral infection.    Most cold sores or fever blisters are small fluid filled blisters around the mouth caused by herpes simplex virus.  The most common strain of the virus causing cold sores is herpes simplex virus 1.  It can be spread by skin contact, sharing eating utensils, or even sharing towels.  Cold sores are contagious to other people until dry. (Approximately 5-7 days).  Wash your hands. You can spread the virus to your eyes through handling your contact lenses after touching the lesions.  Most people experience pain at the sight or tingling sensations in their lips that may begin before the ulcers erupt.  Herpes simplex is treatable but not curable.  It may lie dormant for a long time and then reappear due to stress or prolonged sun exposure.  Many patients have success in treating their cold sores with an over the counter topical called Abreva.  You may apply the cream up to 5 times daily (maximum 10 days) until healing occurs.  If you would like to use an oral antiviral medication to speed the healing of your cold sore, I have sent a prescription to your local pharmacy Valacyclovir 2 gm twice daily for 1 day    HOME CARE:   Wash your hands frequently.  Do not pick at or rub the sore.  Don't open the blisters.  Avoid kissing other people during this time.  Avoid sharing drinking glasses, eating utensils, or razors.  Do not handle contact lenses unless you have thoroughly washed your hands with soap and warm water!  Avoid oral sex during this time.  Herpes from sores on your mouth can spread to your partner's genital area.  Avoid contact with anyone who has eczema or a weakened immune system.  Cold sores are often triggered by exposure to intense sunlight, use a lip balm containing a sunscreen (SPF 30 or higher).  GET HELP RIGHT AWAY  IF:   Blisters look infected.  Blisters occur near or in the eye.  Symptoms last longer than 10 days.  Your symptoms become worse.  MAKE SURE YOU:   Understand these instructions.  Will watch your condition.  Will get help right away if you are not doing well or get worse.    Your e-visit answers were reviewed by a board certified advanced clinical practitioner to complete your personal care plan.  Depending upon the condition, your plan could have  Included both over the counter or prescription medications.    Please review your pharmacy choice.  Be sure that the pharmacy you have chosen is open so that you can pick up your prescription now.  If there is a problem you csn message your provider in MyChart to have the prescription routed to another pharmacy.    Your safety is important to us.  If you have drug allergies check our prescription carefully.  For the next 24 hours you can use MyChart to ask questions about today's visit, request a non-urgent call back, or ask for a work or school excuse from your e-visit provider.  You will get an email in the next two days asking about your experience.  I hope that your e-visit has been valuable and will speed your recovery.  

## 2017-08-28 ENCOUNTER — Telehealth: Payer: 59 | Admitting: Family

## 2017-08-28 DIAGNOSIS — B009 Herpesviral infection, unspecified: Secondary | ICD-10-CM

## 2017-08-28 MED ORDER — VALACYCLOVIR HCL 1 G PO TABS: ORAL_TABLET | ORAL | 0 refills | Status: DC

## 2017-08-28 NOTE — Progress Notes (Signed)
Thank you for the details you put in the comment boxes. Those details really help [place] take better care of you.   We are sorry that you are not feeling well.  Here is how we plan to help!  Based on what you have shared with me it does look like you have a viral infection.    Most cold sores or fever blisters are small fluid filled blisters around the mouth caused by herpes simplex virus.  The most common strain of the virus causing cold sores is herpes simplex virus 1.  It can be spread by skin contact, sharing eating utensils, or even sharing towels.  Cold sores are contagious to other people until dry. (Approximately 5-7 days).  Wash your hands. You can spread the virus to your eyes through handling your contact lenses after touching the lesions.  Most people experience pain at the sight or tingling sensations in their lips that may begin before the ulcers erupt.  Herpes simplex is treatable but not curable.  It may lie dormant for a long time and then reappear due to stress or prolonged sun exposure.  Many patients have success in treating their cold sores with an over the counter topical called Abreva.  You may apply the cream up to 5 times daily (maximum 10 days) until healing occurs.  If you would like to use an oral antiviral medication to speed the healing of your cold sore, I have sent a prescription to your local pharmacy Valacyclovir 2 gm twice daily for 1 day    HOME CARE:   Wash your hands frequently.  Do not pick at or rub the sore.  Don't open the blisters.  Avoid kissing other people during this time.  Avoid sharing drinking glasses, eating utensils, or razors.  Do not handle contact lenses unless you have thoroughly washed your hands with soap and warm water!  Avoid oral sex during this time.  Herpes from sores on your mouth can spread to your partner's genital area.  Avoid contact with anyone who has eczema or a weakened immune system.  Cold sores are often triggered  by exposure to intense sunlight, use a lip balm containing a sunscreen (SPF 30 or higher).  GET HELP RIGHT AWAY IF:   Blisters look infected.  Blisters occur near or in the eye.  Symptoms last longer than 10 days.  Your symptoms become worse.  MAKE SURE YOU:   Understand these instructions.  Will watch your condition.  Will get help right away if you are not doing well or get worse.    Your e-visit answers were reviewed by a board certified advanced clinical practitioner to complete your personal care plan.  Depending upon the condition, your plan could have  Included both over the counter or prescription medications.    Please review your pharmacy choice.  Be sure that the pharmacy you have chosen is open so that you can pick up your prescription now.  If there is a problem you csn message your provider in [place] to have the prescription routed to another pharmacy.    Your safety is important to [place].  If you have drug allergies check our prescription carefully.  For the next 24 hours you can use MyChart to ask questions about today's visit, request a non-urgent call back, or ask for a work or school excuse from your e-visit provider.  You will get an email in the next two days asking about your experience.  I hope that  your e-visit has been valuable and will speed your recovery.

## 2017-10-27 ENCOUNTER — Telehealth: Payer: 59 | Admitting: Family

## 2017-10-27 DIAGNOSIS — J069 Acute upper respiratory infection, unspecified: Secondary | ICD-10-CM

## 2017-10-27 MED ORDER — BENZONATATE 100 MG PO CAPS: 100.0000 mg | ORAL_CAPSULE | Freq: Three times a day (TID) | ORAL | 0 refills | Status: DC | PRN

## 2017-10-27 NOTE — Progress Notes (Signed)

## 2017-11-07 ENCOUNTER — Other Ambulatory Visit: Payer: Self-pay | Admitting: Family

## 2017-11-07 DIAGNOSIS — B009 Herpesviral infection, unspecified: Secondary | ICD-10-CM

## 2017-11-08 ENCOUNTER — Telehealth: Payer: 59 | Admitting: Family

## 2017-11-08 DIAGNOSIS — B009 Herpesviral infection, unspecified: Secondary | ICD-10-CM

## 2017-11-08 MED ORDER — VALACYCLOVIR HCL 1 G PO TABS: ORAL_TABLET | ORAL | 1 refills | Status: DC

## 2017-11-08 NOTE — Progress Notes (Signed)
Thank you for the details you included in the comment boxes. Those details are very helpful in determining the best course of treatment for you and help [place] to provide the best care. Regarding refills and long-term treatment, these medications can cause liver problems in some people long-term so we can only give a limited supply through the e-visit program as we are not seeing you face-to-face. I doubt this will happen to you, but these are the guidelines we follow for safety. You need to be seen by someone who can give you a long-term suppression prescription rather than just for outbreaks. I will definitely send a refill here, though.   We are sorry that you are not feeling well.  Here is how we plan to help!  Based on what you have shared with me it does look like you have a viral infection.    Most cold sores or fever blisters are small fluid filled blisters around the mouth caused by herpes simplex virus.  The most common strain of the virus causing cold sores is herpes simplex virus 1.  It can be spread by skin contact, sharing eating utensils, or even sharing towels.  Cold sores are contagious to other people until dry. (Approximately 5-7 days).  Wash your hands. You can spread the virus to your eyes through handling your contact lenses after touching the lesions.  Most people experience pain at the sight or tingling sensations in their lips that may begin before the ulcers erupt.  Herpes simplex is treatable but not curable.  It may lie dormant for a long time and then reappear due to stress or prolonged sun exposure.  Many patients have success in treating their cold sores with an over the counter topical called Abreva.  You may apply the cream up to 5 times daily (maximum 10 days) until healing occurs.  If you would like to use an oral antiviral medication to speed the healing of your cold sore, I have sent a prescription to your local pharmacy Valacyclovir 2 gm twice daily for 1 day    HOME  CARE:   Wash your hands frequently.  Do not pick at or rub the sore.  Don't open the blisters.  Avoid kissing other people during this time.  Avoid sharing drinking glasses, eating utensils, or razors.  Do not handle contact lenses unless you have thoroughly washed your hands with soap and warm water!  Avoid oral sex during this time.  Herpes from sores on your mouth can spread to your partner's genital area.  Avoid contact with anyone who has eczema or a weakened immune system.  Cold sores are often triggered by exposure to intense sunlight, use a lip balm containing a sunscreen (SPF 30 or higher).  GET HELP RIGHT AWAY IF:   Blisters look infected.  Blisters occur near or in the eye.  Symptoms last longer than 10 days.  Your symptoms become worse.  MAKE SURE YOU:   Understand these instructions.  Will watch your condition.  Will get help right away if you are not doing well or get worse.    Your e-visit answers were reviewed by a board certified advanced clinical practitioner to complete your personal care plan.  Depending upon the condition, your plan could have  Included both over the counter or prescription medications.    Please review your pharmacy choice.  Be sure that the pharmacy you have chosen is open so that you can pick up your prescription now.  If  there is a problem you csn message your provider in Start to have the prescription routed to another pharmacy.    Your safety is important to [place].  If you have drug allergies check our prescription carefully.  For the next 24 hours you can use MyChart to ask questions about today's visit, request a non-urgent call back, or ask for a work or school excuse from your e-visit provider.  You will get an email in the next two days asking about your experience.  I hope that your e-visit has been valuable and will speed your recovery.

## 2017-11-13 ENCOUNTER — Telehealth: Payer: 59 | Admitting: Nurse Practitioner

## 2017-11-13 DIAGNOSIS — L700 Acne vulgaris: Secondary | ICD-10-CM

## 2017-11-13 MED ORDER — MINOCYCLINE HCL 50 MG PO TABS: 50.0000 mg | ORAL_TABLET | Freq: Two times a day (BID) | ORAL | 0 refills | Status: DC

## 2017-11-13 NOTE — Progress Notes (Signed)
We are sorry that you are experiencing this issue.  Here is how we plan to help!  Based on what you shared with me it looks like you have severe acne.  Acne is a disorder of the hair follicles and oil glands (sebaceous glands). The sebaceous glands secrete oils to keep the skin moist.  When the glands get clogged, it can lead to pimples or cysts.  These cysts may become infected and leave scars. Acne is very common and normally occurs at puberty.  Acne is also inherited.  Your personal care plan consists of the following recommendations:  I recommend that you use a daily cleanser  You might try an over the counter cleanser that has benzoyl peroxide.  I recommend that you start with a product that has 2.5% benzoyl peroxide.  Stronger concentrations have not been shown to be more effective.     I have also prescribed one of the following additional therapies:  Minocycline an oral antibiotic 50 mg twice a day   ** I am unable to prescribe your tretinoin gel in an evisit, because it is not in our protocols. I do appoligize. Hopefully the minocycline orally will help until you can speak with your dermatologist  If excessive dryness or peeling occurs, reduce dose frequency or concentration of the topical scrubs.  If excessive stinging or burning occurs, remove the topical gel with mild soap and water and resume at a lower dose the next day.  Remember oral antibiotics and topical acne treatments may increase your sensitivity to the sun!  HOME CARE:  Do not squeeze pimples because that can often lead to infections, worse acne, and scars.  Use a moisturizer that contains retinoid or fruit acids that may inhibit the development of new acne lesions.  Although there is not a clear link that foods can cause acne, doctors do believe that too many sweets predispose you to skin problems.  GET HELP RIGHT AWAY IF:  If your acne gets worse or is not better within 10 days.  If you become  depressed.  If you become pregnant, discontinue medications and call your OB/GYN.  MAKE SURE YOU:  Understand these instructions.  Will watch your condition.  Will get help right away if you are not doing well or get worse.   Your e-visit answers were reviewed by a board certified advanced clinical practitioner to complete your personal care plan.  Depending upon the condition, your plan could have included both over the counter or prescription medications.  Please review your pharmacy choice.  If there is a problem, you may contact your provider through [employer] and have the prescription routed to another pharmacy.  Your safety is important to [place].  If you have drug allergies check your prescription carefully.  For the next 24 hours you can use MyChart to ask questions about today's visit, request a non-urgent call back, or ask for a work or school excuse from your e-visit provider.  You will get an email in the next two days asking about your experience. I hope that your e-visit has been valuable and will speed your recovery.

## 2017-12-30 ENCOUNTER — Encounter: Payer: Self-pay | Admitting: Nurse Practitioner

## 2017-12-30 ENCOUNTER — Encounter: Payer: No Typology Code available for payment source | Admitting: Nurse Practitioner

## 2017-12-30 NOTE — Progress Notes (Signed)
Kimberly Cooper present in office today to establish care and for annual CPE including breast and pelvic exam. According to her chart an annual exam was done 05/26/2017 by Dr. Jacqulyn Bath. I informed Kimberly Cooper about this.  She stated that she was unaware the visit was billed as an annual preventative exam. I then asked her when she had her last PAP smear.  She informed me it was done 2017 by provider at [facility] and it was normal.denied any history of abnormal PAP smear. I advised her about current guideline (in absence of abnormal PAP, it is recommended to be repeated every 3years). Therefore she does not need repeat PAP at this time. She then informed me that she changed insurance plan with same company, but unsure if another annual exam will be covered if done before 05/26/2017. She wanted to know what cost of annual preventive exam if it is not covered by her insurance. I informed her I did not have that information. I advised her to call her insurance. She then stated that has pelvic pain and does not know the cause. I asked her if she wanted to use this visit to discuss pelvic pain? She declined, stating she will rather not do anything till she talks to her insurance. I advised her to reschedule the appointment if she changes her mind.  This encounter was created in error - please disregard.

## 2018-01-05 ENCOUNTER — Encounter: Payer: Self-pay | Admitting: Family Medicine

## 2018-01-05 ENCOUNTER — Other Ambulatory Visit: Payer: Self-pay

## 2018-01-05 ENCOUNTER — Ambulatory Visit (INDEPENDENT_AMBULATORY_CARE_PROVIDER_SITE_OTHER): Payer: No Typology Code available for payment source | Admitting: Family Medicine

## 2018-01-05 VITALS — BP 110/76 | HR 112 | Temp 99.2°F | Resp 16 | Ht 60.5 in | Wt 154.8 lb

## 2018-01-05 DIAGNOSIS — L700 Acne vulgaris: Secondary | ICD-10-CM

## 2018-01-05 DIAGNOSIS — Z30015 Encounter for initial prescription of vaginal ring hormonal contraceptive: Secondary | ICD-10-CM

## 2018-01-05 LAB — POCT URINE PREGNANCY: PREG TEST UR: NEGATIVE

## 2018-01-05 MED ORDER — ETONOGESTREL-ETHINYL ESTRADIOL 0.12-0.015 MG/24HR VA RING: VAGINAL_RING | VAGINAL | 12 refills | Status: DC

## 2018-01-05 MED FILL — NUVARING VAGINAL RING: 0.12-0.015 | 28 days supply | Qty: 1 | Fill #0

## 2018-01-05 NOTE — Patient Instructions (Addendum)
[email or web address]     IF you received an x-ray today, you will receive an invoice from [facility]. Please contact [facility] at [phone] with questions or concerns regarding your invoice.   IF you received labwork today, you will receive an invoice from [facility]. Please contact [facility] at [phone] with questions or concerns regarding your invoice.   Our billing staff will not be able to assist you with questions regarding bills from these companies.  You will be contacted with the lab results as soon as they are available. The fastest way to get your results is to activate your My Chart account. Instructions are located on the last page of this paperwork. If you have not heard from [place] regarding the results in 2 weeks, please contact this office.     Acne Acne is a skin problem that causes pimples. Acne occurs when the pores in the skin get blocked. The pores may become infected with bacteria, or they may become red, sore, and swollen. Acne is a common skin problem, especially for teenagers. Acne usually goes away over time. What are the causes? Each pore contains an oil gland. Oil glands make an oily substance that is called sebum. Acne happens when these glands get plugged with sebum, dead skin cells, and dirt. Then, the bacteria that are normally found in the oil glands multiply and cause inflammation. Acne is commonly triggered by changes in your hormones. These hormonal changes can cause the oil glands to get bigger and to make more sebum. Factors that can make acne worse include:  Hormone changes during: ? Adolescence. ? Women's menstrual cycles. ? Pregnancy.  Oil-based cosmetics and hair products.  Harshly scrubbing the skin.  Strong soaps.  Stress.  Hormone problems that are due to certain diseases.  Long or oily hair rubbing against the skin.  Certain medicines.  Pressure from headbands, backpacks, or shoulder pads.  Exposure to certain oils  and chemicals.  What increases the risk? This condition is more likely to develop in:  Teenagers.  People who have a family history of acne.  What are the signs or symptoms? Acne often occurs on the face, neck, chest, and upper back. Symptoms include:  Small, red bumps (pimples or papules).  Whiteheads.  Blackheads.  Small, pus-filled pimples (pustules).  Big, red pimples or pustules that feel tender.  More severe acne can cause:  An infected area that contains a collection of pus (abscess).  Hard, painful, fluid-filled sacs (cysts).  Scars.  How is this diagnosed? This condition is diagnosed with a medical history and physical exam. Blood tests may also be done. How is this treated? Treatment for this condition can vary depending on the severity of your acne. Treatment may include:  Creams and lotions that prevent oil glands from clogging.  Creams and lotions that treat or prevent infections and inflammation.  Antibiotic medicines that are applied to the skin or taken as a pill.  Pills that decrease sebum production.  Birth control pills.  Light or laser treatments.  Surgery.  Injections of medicine into the affected areas.  Chemicals that cause peeling of the skin.  Your health care provider will also recommend the best way to take care of your skin. Good skin care is the most important part of treatment. Follow these instructions at home: Skin care Take care of your skin as told by your health care provider. You may be told to do these things:  Wash your skin gently at least two times each  day, as well as: ? After you exercise. ? Before you go to bed.  Use mild soap.  Apply a water-based skin moisturizer after you wash your skin.  Use a sunscreen or sunblock with SPF 30 or greater. This is especially important if you are using acne medicines.  Choose cosmetics that will not plug your oil glands (are noncomedogenic).  Medicines  Take  over-the-counter and prescription medicines only as told by your health care provider.  If you were prescribed an antibiotic medicine, apply or take it as told by your health care provider. Do not stop taking the antibiotic even if your condition improves. General instructions  Keep your hair clean and off of your face. If you have oily hair, shampoo your hair regularly or daily.  Avoid leaning your chin or forehead against your hands.  Avoid wearing tight headbands or hats.  Avoid picking or squeezing your pimples. That can make your acne worse and cause scarring.  Keep all follow-up visits as told by your health care provider. This is important.  Shave gently and only when necessary.  Keep a food journal to figure out if any foods are linked with your acne. Contact a health care provider if:  Your acne is not better after eight weeks.  Your acne gets worse.  You have a large area of skin that is red or tender.  You think that you are having side effects from any acne medicine. This information is not intended to replace advice given to you by your health care provider. Make sure you discuss any questions you have with your health care provider. Document Released: 12/04/2000 Document Revised: 08/07/2016 Document Reviewed: 02/13/2015 Elsevier Interactive Patient Education  [employer].

## 2018-01-05 NOTE — Progress Notes (Signed)
Chief Complaint  Patient presents with  . Follow-up    acne,  need derm referral    HPI  She reports that she sees Dr. Allyson Sabal for Dermatology She has an appointment at [facility] for acne vulgaris She reports that she is getting married in June 2019 she wants to have kids soon and is not sure if she wants to try Accutane at this time She reports that she is not sure if she wants to go that route She states that the last thing she was prescribed was She is now using african black soap and urban rx She states that she did a e-visit to get more retinoid acid She states that she gets vesicles and darks spots and comedones She has irregular menses Patient's last menstrual period was 12/01/2017.  Past Medical History:  Diagnosis Date  . Allergy   . Chlamydia   . GERD (gastroesophageal reflux disease)   . Gonorrhea   . MRSA infection greater than 3 months ago 2003   ABDOMINAL CYST    Current Outpatient Medications  Medication Sig Dispense Refill  . albuterol (PROVENTIL HFA;VENTOLIN HFA) 108 (90 Base) MCG/ACT inhaler Inhale 2 puffs into the lungs every 6 (six) hours as needed for wheezing or shortness of breath. 1 Inhaler 0  . benzonatate (TESSALON PERLES) 100 MG capsule Take 1 capsule (100 mg total) 3 (three) times daily as needed by mouth. (Patient not taking: Reported on 01/05/2018) 20 capsule 0  . Dexlansoprazole (DEXILANT) 30 MG capsule Take 1 capsule (30 mg total) by mouth daily. (Patient not taking: Reported on 01/05/2018) 15 capsule 0  . etonogestrel-ethinyl estradiol (NUVARING) 0.12-0.015 MG/24HR vaginal ring Insert vaginally and leave in place for 3 consecutive weeks, then remove for 1 week. 1 each 12  . minocycline (DYNACIN) 50 MG tablet Take 1 tablet (50 mg total) by mouth 2 (two) times daily. (Patient not taking: Reported on 01/05/2018) 20 tablet 0  . omeprazole (PRILOSEC) 40 MG capsule Take 1 capsule (40 mg total) by mouth daily. (Patient not taking: Reported on  01/05/2018) 30 capsule 0  . valACYclovir (VALTREX) 1000 MG tablet Take 2 tablets by mouth twice daily x 1 day (Patient not taking: Reported on 01/05/2018) 4 tablet 1   No current facility-administered medications for this visit.     Allergies:  Allergies  Allergen Reactions  . Other Hives    All Raw Nuts: Swelling  . Penicillins Hives    History reviewed. No pertinent surgical history.  Social History   Socioeconomic History  . Marital status: Single    Spouse name: None  . Number of children: None  . Years of education: None  . Highest education level: None  Social Needs  . Financial resource strain: None  . Food insecurity - worry: None  . Food insecurity - inability: None  . Transportation needs - medical: None  . Transportation needs - non-medical: None  Occupational History  . None  Tobacco Use  . Smoking status: Never Smoker  . Smokeless tobacco: Never Used  Substance and Sexual Activity  . Alcohol use: Yes    Alcohol/week: 1.2 oz    Types: 1 Glasses of wine, 1 Cans of beer per week    Comment: social  . Drug use: No  . Sexual activity: Yes    Birth control/protection: None  Other Topics Concern  . None  Social History Narrative  . None    Family History  Problem Relation Age of Onset  . Hypertension Mother   .  Heart disease Father 70       MI  . Other Father        gunshot, paralysis  . Cancer Maternal Grandfather        lung/prostate  . Cancer Other        GGM has colon cancer, other great aunts and uncles with cancer  . Stroke Maternal Grandmother      ROS Review of Systems See HPI Constitution: No fevers or chills No malaise No diaphoresis Skin: No rash or itching Eyes: no blurry vision, no double vision GU: no dysuria or hematuria Neuro: no dizziness or headaches all others reviewed and negative   Objective: Vitals:   01/05/18 1456 01/05/18 1510  BP: 110/76   Pulse: (!) 130 (!) 112  Resp: 16   Temp: 99.2 F (37.3 C)     TempSrc: Oral   SpO2: 97%   Weight: 154 lb 12.8 oz (70.2 kg)   Height: 5' 0.5" (1.537 m)     Physical Exam  Constitutional: She is oriented to person, place, and time. She appears well-developed and well-nourished.  HENT:  Head: Normocephalic and atraumatic.  Eyes: Conjunctivae and EOM are normal.  Cardiovascular: Normal rate, regular rhythm and normal heart sounds.  No murmur heard. Pulmonary/Chest: Effort normal and breath sounds normal. No stridor. No respiratory distress.  Neurological: She is alert and oriented to person, place, and time.  Skin: Skin is warm. Capillary refill takes less than 2 seconds.  Psychiatric: She has a normal mood and affect. Her behavior is normal. Judgment and thought content normal.     Assessment and Plan Binnie was seen today for follow-up.  Diagnoses and all orders for this visit:  Acne vulgaris- discussed referral for accutane Started contraception today -     Ambulatory referral to Dermatology  Encounter for initial prescription of vaginal ring hormonal contraceptive- Education given regarding options for contraception, including barrier methods, injectable contraception, IUD placement, oral contraceptives.  -     etonogestrel-ethinyl estradiol (NUVARING) 0.12-0.015 MG/24HR vaginal ring; Insert vaginally and leave in place for 3 consecutive weeks, then remove for 1 week. -     POCT urine pregnancy  Other orders -     Cancel: Tdap vaccine greater than or equal to 7yo IM     Yussuf Sawyers A [employer]

## 2018-01-06 ENCOUNTER — Encounter: Payer: Self-pay | Admitting: Family Medicine

## 2018-01-06 ENCOUNTER — Telehealth: Payer: Self-pay | Admitting: Medical

## 2018-01-06 NOTE — Telephone Encounter (Signed)
She has changed PCP removed you as primary

## 2018-01-06 NOTE — Telephone Encounter (Signed)
I received a courtesy copy of an office note from another primary care office.  It would appear that the patient has transferred care.  Please remove me as PCP so I do not get copies on these notes.  Or potentially call patient to see who they are going to use for primary care as they should stick with one primary care office for continuity of care.  Of note they have jumped around to several offices in the past year

## 2018-01-08 ENCOUNTER — Telehealth: Payer: No Typology Code available for payment source | Admitting: Nurse Practitioner

## 2018-01-08 DIAGNOSIS — B009 Herpesviral infection, unspecified: Secondary | ICD-10-CM

## 2018-01-08 DIAGNOSIS — B001 Herpesviral vesicular dermatitis: Secondary | ICD-10-CM | POA: Diagnosis not present

## 2018-01-08 MED ORDER — VALACYCLOVIR HCL 1 G PO TABS: ORAL_TABLET | ORAL | 1 refills | Status: DC

## 2018-01-08 NOTE — Progress Notes (Signed)
We are sorry that you are not feeling well.  Here is how we plan to help!  Based on what you have shared with me it does look like you have a viral infection.    Most cold sores or fever blisters are small fluid filled blisters around the mouth caused by herpes simplex virus.  The most common strain of the virus causing cold sores is herpes simplex virus 1.  It can be spread by skin contact, sharing eating utensils, or even sharing towels.  Cold sores are contagious to other people until dry. (Approximately 5-7 days).  Wash your hands. You can spread the virus to your eyes through handling your contact lenses after touching the lesions.  Most people experience pain at the sight or tingling sensations in their lips that may begin before the ulcers erupt.  Herpes simplex is treatable but not curable.  It may lie dormant for a long time and then reappear due to stress or prolonged sun exposure.  Many patients have success in treating their cold sores with an over the counter topical called Abreva.  You may apply the cream up to 5 times daily (maximum 10 days) until healing occurs.  If you would like to use an oral antiviral medication to speed the healing of your cold sore, I have sent a prescription to your local pharmacy Valacyclovir 2 gm twice daily for 1 day    HOME CARE:   Wash your hands frequently.  Do not pick at or rub the sore.  Don't open the blisters.  Avoid kissing other people during this time.  Avoid sharing drinking glasses, eating utensils, or razors.  Do not handle contact lenses unless you have thoroughly washed your hands with soap and warm water!  Avoid oral sex during this time.  Herpes from sores on your mouth can spread to your partner's genital area.  Avoid contact with anyone who has eczema or a weakened immune system.  Cold sores are often triggered by exposure to intense sunlight, use a lip balm containing a sunscreen (SPF 30 or higher).  GET HELP RIGHT AWAY  IF:   Blisters look infected.  Blisters occur near or in the eye.  Symptoms last longer than 10 days.  Your symptoms become worse.  MAKE SURE YOU:   Understand these instructions.  Will watch your condition.  Will get help right away if you are not doing well or get worse.    Your e-visit answers were reviewed by a board certified advanced clinical practitioner to complete your personal care plan.  Depending upon the condition, your plan could have  Included both over the counter or prescription medications.    Please review your pharmacy choice.  Be sure that the pharmacy you have chosen is open so that you can pick up your prescription now.  If there is a problem you csn message your provider in MyChart to have the prescription routed to another pharmacy.    Your safety is important to us.  If you have drug allergies check our prescription carefully.  For the next 24 hours you can use MyChart to ask questions about today's visit, request a non-urgent call back, or ask for a work or school excuse from your e-visit provider.  You will get an email in the next two days asking about your experience.  I hope that your e-visit has been valuable and will speed your recovery.  

## 2018-01-24 ENCOUNTER — Telehealth: Payer: Self-pay

## 2018-01-24 NOTE — Telephone Encounter (Signed)
Provider, FYI. 

## 2018-01-24 NOTE — Telephone Encounter (Signed)
Copied from [place] [phone]. Topic: General - Other >> Jan 24, 2018  2:54 PM Boyd Kerbs wrote: Reason for CRM:   Juliann Pulse for [facility]. Called and said Antoninette did not show or call for her appt at [facility].  This was a referral . They will not reschedule appt. If a no show.

## 2018-01-26 ENCOUNTER — Telehealth: Payer: Self-pay | Admitting: Family Medicine

## 2018-01-26 NOTE — Telephone Encounter (Signed)
Copied from [place] [id number]. Topic: Quick Communication - Rx Refill/Question >> Jan 26, 2018 11:34 AM Arletha Grippe wrote: Medication: medication for nausea - she started nuva ring a few weeks ago, and she is now nauseous   Has the patient contacted their pharmacy? No.   (Agent: If no, request that the patient contact the pharmacy for the refill.)   Preferred Pharmacy (with phone number or street name): [facility]    Agent: Please be advised that RX refills may take up to 3 business days. We ask that you follow-up with your pharmacy.

## 2018-01-26 NOTE — Telephone Encounter (Signed)
Please advise 

## 2018-01-27 ENCOUNTER — Telehealth: Payer: Self-pay | Admitting: Family Medicine

## 2018-01-27 MED ORDER — ONDANSETRON HCL 4 MG PO TABS: 4.0000 mg | ORAL_TABLET | Freq: Three times a day (TID) | ORAL | 0 refills | Status: DC | PRN

## 2018-01-27 MED FILL — ONDANSETRON HCL 4 MG TABLET: 4 | 7 days supply | Qty: 20 | Fill #0

## 2018-01-27 NOTE — Telephone Encounter (Signed)
Please advise. dgaddy

## 2018-01-27 NOTE — Telephone Encounter (Signed)
Copied from [place]. Topic: General - Other >> Jan 27, 2018  9:35 AM Lolita Rieger, RMA wrote: Reason for CRM: pt would like her dermatology referral sent to [facility] fax # [phone] pt has appointment with them 02/03/18 @2 :30 and they need the referral before her appointment

## 2018-01-27 NOTE — Telephone Encounter (Signed)
Please let the patient know to try ginger or peppermint for her nausea and if that does not work to take zofran.  If the symptoms persist we can change her birth control.   Thank you.

## 2018-01-27 NOTE — Telephone Encounter (Signed)
Copied from [place]. Topic: General - Other >> Jan 27, 2018  9:35 AM Kimberly Cooper, RMA wrote: Reason for CRM: pt would like her dermatology referral sent to [facility] fax # [phone] pt has appointment with them 02/03/18 @2 :30 and they need the referral before her appointment  -----------------------------------------------------------------------------------------  Referral sent to [facility] in [place] per pt's request. Spoke with pt and confirmed that we are sending referral today (01/27/18). Pt also following up on earlier message concerning nuva ring and was wondering if something can be called in for nausea. Pt also stated if needed, she would be willing to switch to another birth control method such as the patch.

## 2018-01-28 NOTE — Telephone Encounter (Signed)
Pt notified and verbalized understanding.

## 2018-01-29 ENCOUNTER — Telehealth: Payer: No Typology Code available for payment source | Admitting: Physician Assistant

## 2018-01-29 DIAGNOSIS — B009 Herpesviral infection, unspecified: Secondary | ICD-10-CM

## 2018-01-29 MED ORDER — VALACYCLOVIR HCL 1 G PO TABS: ORAL_TABLET | ORAL | 0 refills | Status: DC

## 2018-01-29 NOTE — Progress Notes (Signed)
We are sorry that you are not feeling well.  Here is how we plan to help!  Based on what you have shared with me it does look like you have a viral infection.    Most cold sores or fever blisters are small fluid filled blisters around the mouth caused by herpes simplex virus.  The most common strain of the virus causing cold sores is herpes simplex virus 1.  It can be spread by skin contact, sharing eating utensils, or even sharing towels.  Cold sores are contagious to other people until dry. (Approximately 5-7 days).  Wash your hands. You can spread the virus to your eyes through handling your contact lenses after touching the lesions.  Most people experience pain at the sight or tingling sensations in their lips that may begin before the ulcers erupt.  Herpes simplex is treatable but not curable.  It may lie dormant for a long time and then reappear due to stress or prolonged sun exposure.  Many patients have success in treating their cold sores with an over the counter topical called Abreva.  You may apply the cream up to 5 times daily (maximum 10 days) until healing occurs.  If you would like to use an oral antiviral medication to speed the healing of your cold sore, I have sent a prescription to your local pharmacy Valacyclovir 2 gm twice daily for 1 day    HOME CARE:   Wash your hands frequently.  Do not pick at or rub the sore.  Don't open the blisters.  Avoid kissing other people during this time.  Avoid sharing drinking glasses, eating utensils, or razors.  Do not handle contact lenses unless you have thoroughly washed your hands with soap and warm water!  Avoid oral sex during this time.  Herpes from sores on your mouth can spread to your partner's genital area.  Avoid contact with anyone who has eczema or a weakened immune system.  Cold sores are often triggered by exposure to intense sunlight, use a lip balm containing a sunscreen (SPF 30 or higher).  GET HELP RIGHT AWAY  IF:   Blisters look infected.  Blisters occur near or in the eye.  Symptoms last longer than 10 days.  Your symptoms become worse.  MAKE SURE YOU:   Understand these instructions.  Will watch your condition.  Will get help right away if you are not doing well or get worse.    Your e-visit answers were reviewed by a board certified advanced clinical practitioner to complete your personal care plan.  Depending upon the condition, your plan could have  Included both over the counter or prescription medications.    Please review your pharmacy choice.  Be sure that the pharmacy you have chosen is open so that you can pick up your prescription now.  If there is a problem you csn message your provider in MyChart to have the prescription routed to another pharmacy.    Your safety is important to us.  If you have drug allergies check our prescription carefully.  For the next 24 hours you can use MyChart to ask questions about today's visit, request a non-urgent call back, or ask for a work or school excuse from your e-visit provider.  You will get an email in the next two days asking about your experience.  I hope that your e-visit has been valuable and will speed your recovery.  

## 2018-02-07 MED FILL — NUVARING VAGINAL RING: 0.12-0.015 | 84 days supply | Qty: 3 | Fill #1

## 2018-02-08 ENCOUNTER — Telehealth: Payer: No Typology Code available for payment source | Admitting: Family

## 2018-02-08 DIAGNOSIS — N949 Unspecified condition associated with female genital organs and menstrual cycle: Secondary | ICD-10-CM

## 2018-02-08 NOTE — Progress Notes (Signed)
Based on what you shared with me it looks like you have a condition that should be evaluated in a face to face office visit.  NOTE: If you entered your credit card information for this eVisit, you will not be charged. You may see a "hold" on your card for the $30 but that hold will drop off and you will not have a charge processed.  If you are having a true medical emergency please call 911.  If you need an urgent face to face visit, [place] has four urgent care centers for your convenience.  If you need care fast and have a high deductible or no insurance consider:   [email or web address] to reserve your spot online an avoid wait times  [facility] [address], [address] [place], [place] [address] 8 am to 8 pm Monday-Friday 10 am to 4 pm Saturday-Sunday *Across the street from [employer]  [facility]  [address]  [place], [address] 8 am to 5 pm Monday-Friday * In the [facility] the [facility]   The following sites will take your  insurance:  . [facility]  [phone] Get Driving Directions Find a Provider at this Location  [address] [place], [place] [address] . 10 am to 8 pm Monday-Friday . 12 pm to 8 pm Saturday-Sunday   . [facility]  [phone] Get Driving Directions Find a Provider at this Location  [address], [address] [place], [place] [address] . 8 am to 8 pm Monday-Friday . 9 am to 6 pm Saturday . 11 am to 6 pm Sunday   . [facility] [place]  [phone] Get Driving Directions  [address] [place], [place] [address] . 8 am to 8 pm Monday-Friday . 8 am to 4 pm Saturday-Sunday   Your e-visit answers were reviewed by a board certified advanced clinical practitioner to complete your personal care plan.  Thank you for using e-Visits. 

## 2018-02-16 ENCOUNTER — Telehealth: Payer: No Typology Code available for payment source | Admitting: Family

## 2018-02-16 DIAGNOSIS — B002 Herpesviral gingivostomatitis and pharyngotonsillitis: Secondary | ICD-10-CM | POA: Diagnosis not present

## 2018-02-16 MED ORDER — VALACYCLOVIR HCL 1 G PO TABS: 2000.0000 mg | ORAL_TABLET | Freq: Two times a day (BID) | ORAL | 0 refills | Status: DC

## 2018-02-16 NOTE — Progress Notes (Signed)
We are sorry that you are not feeling well.  Here is how we plan to help!  Based on what you have shared with me it looks like you have a Cold Sore. A cold sore (fever blister) is a skin infection caused by the herpes simplex virus (HSV-1). HSV-1 is closely related to the virus that causes genital herpes (HSV-2), but they are not the same even though both viruses can cause oral and genital infections. Cold sores are small, fluid-filled sores inside of the mouth or on the lips, gums, nose, chin, cheeks, or fingers.   The herpes simplex virus can be easily passed (contagious) to other people through close personal contact, such as kissing or sharing personal items. The virus can also spread to other parts of the body, such as the eyes or genitals. Cold sores are contagious until the sores crust over completely. They often heal within 2 weeks. Once a person is infected, the herpes simplex virus remains permanently in the body. Therefore, there is no cure for cold sores, and they often recur when a person is tired, stressed, sick, or gets too much sun. Additional factors that can cause a recurrence include hormone changes in menstruation or pregnancy, certain drugs, and cold weather.  CAUSES  Cold sores are caused by the herpes simplex virus. The virus is spread from person to person through close contact, such as through kissing, touching the affected area, or sharing personal items such as lip balm, razors, or eating utensils.   I have sent in a prescription to your pharmacy of Valacyclovir, or Valtrex, 2000 mg twice a day for 1 day.   HOME CARE INSTRUCTIONS   Only take over-the-counter or prescription medicines for pain, discomfort, or fever as directed by your caregiver. Do not use aspirin.   Use a cotton-tip swab to apply creams or gels to your sores.   Do not touch the sores or pick the scabs. Wash your hands often. Do not touch your eyes without washing your hands first.   Avoid kissing, oral  sex, and sharing personal items until sores heal.   Apply an ice pack on your sores for 10-15 minutes to ease any discomfort.   Avoid hot, cold, or salty foods because they may hurt your mouth. Eat a soft, bland diet to avoid irritating the sores. Use a straw to drink if you have pain when drinking out of a glass.   Keep sores clean and dry to prevent an infection of other tissues.   Avoid the sun and limit stress if these things trigger outbreaks. If sun causes cold sores, apply sunscreen on the lips before being out in the sun.  SEEK MEDICAL CARE IF:   You have a fever or persistent symptoms for more than 2-3 days.   You have a fever and your symptoms suddenly get worse.   You have pus, not clear fluid, coming from the sores.   You have redness that is spreading.   You have pain or irritation in your eye.   You get sores on your genitals.   Your sores do not heal within 2 weeks.   You have a weakened immune system.   You have frequent recurrences of cold sores.    MAKE SURE YOU   Understand these instructions.  Will watch your condition.  Will get help right away if you are not doing well or get worse.  Your e-visit answers were reviewed by a board certified advanced clinical practitioner to complete your   personal care plan.  Depending on the condition, your plan could have included both over the counter or prescription medications.  If there is a problem please reply  once you have received a response from your provider.  Your safety is important to us.  If you have drug allergies check your prescription carefully.    You can use MyChart to ask questions about today's visit, request a non-urgent call back, or ask for a work or school excuse.  You will get an e-mail in the next two days asking about your experience.  I hope that your e-visit has been valuable and will speed your recovery. Thank you for using e-visits.     

## 2018-02-19 ENCOUNTER — Ambulatory Visit: Payer: Self-pay | Admitting: Family Medicine

## 2018-03-07 MED FILL — MYORISAN 40 MG CAPSULE: 40 | 30 days supply | Qty: 30 | Fill #0

## 2018-03-16 ENCOUNTER — Telehealth: Payer: No Typology Code available for payment source | Admitting: Family

## 2018-03-16 ENCOUNTER — Other Ambulatory Visit: Payer: Self-pay | Admitting: Family

## 2018-03-16 DIAGNOSIS — B002 Herpesviral gingivostomatitis and pharyngotonsillitis: Secondary | ICD-10-CM

## 2018-03-16 MED ORDER — VALACYCLOVIR HCL 1 G PO TABS: 2000.0000 mg | ORAL_TABLET | Freq: Two times a day (BID) | ORAL | 0 refills | Status: DC

## 2018-03-16 NOTE — Progress Notes (Signed)
We are sorry that you are not feeling well.  Here is how we plan to help!  Based on what you have shared with me it looks like you have a Cold Sore. A cold sore (fever blister) is a skin infection caused by the herpes simplex virus (HSV-1). HSV-1 is closely related to the virus that causes genital herpes (HSV-2), but they are not the same even though both viruses can cause oral and genital infections. Cold sores are small, fluid-filled sores inside of the mouth or on the lips, gums, nose, chin, cheeks, or fingers.   The herpes simplex virus can be easily passed (contagious) to other people through close personal contact, such as kissing or sharing personal items. The virus can also spread to other parts of the body, such as the eyes or genitals. Cold sores are contagious until the sores crust over completely. They often heal within 2 weeks. Once a person is infected, the herpes simplex virus remains permanently in the body. Therefore, there is no cure for cold sores, and they often recur when a person is tired, stressed, sick, or gets too much sun. Additional factors that can cause a recurrence include hormone changes in menstruation or pregnancy, certain drugs, and cold weather.  CAUSES  Cold sores are caused by the herpes simplex virus. The virus is spread from person to person through close contact, such as through kissing, touching the affected area, or sharing personal items such as lip balm, razors, or eating utensils.   I have sent in a prescription to your pharmacy of Valacyclovir, or Valtrex, 2000 mg twice a day for 1 day.   HOME CARE INSTRUCTIONS  Only take over-the-counter or prescription medicines for pain, discomfort, or fever as directed by your caregiver. Do not use aspirin.   Use a cotton-tip swab to apply creams or gels to your sores.   Do not touch the sores or pick the scabs. Wash your hands often. Do not touch your eyes without washing your hands first.   Avoid kissing, oral sex,  and sharing personal items until sores heal.   Apply an ice pack on your sores for 10-15 minutes to ease any discomfort.   Avoid hot, cold, or salty foods because they may hurt your mouth. Eat a soft, bland diet to avoid irritating the sores. Use a straw to drink if you have pain when drinking out of a glass.   Keep sores clean and dry to prevent an infection of other tissues.   Avoid the sun and limit stress if these things trigger outbreaks. If sun causes cold sores, apply sunscreen on the lips before being out in the sun.   SEEK MEDICAL CARE IF:  You have a fever or persistent symptoms for more than 2-3 days.   You have a fever and your symptoms suddenly get worse.   You have pus, not clear fluid, coming from the sores.   You have redness that is spreading.   You have pain or irritation in your eye.   You get sores on your genitals.   Your sores do not heal within 2 weeks.   You have a weakened immune system.   You have frequent recurrences of cold sores.     MAKE SURE YOU  Understand these instructions. Will watch your condition. Will get help right away if you are not doing well or get worse.  Your e-visit answers were reviewed by a board certified advanced clinical practitioner to complete your personal care plan.    personal care plan.  Depending on the condition, your plan could have included both over the counter or prescription medications.  If there is a problem please reply  once you have received a response from your provider.  Your safety is important to us.  If you have drug allergies check your prescription carefully.    You can use MyChart to ask questions about today's visit, request a non-urgent call back, or ask for a work or school excuse.  You will get an e-mail in the next two days asking about your experience.  I hope that your e-visit has been valuable and will speed your recovery. Thank you for using e-visits.     

## 2018-03-17 ENCOUNTER — Encounter: Payer: Self-pay | Admitting: Family Medicine

## 2018-03-17 MED FILL — valACYclovir HCL 1 GM TABS: 1 | 1 days supply | Qty: 4 | Fill #0

## 2018-03-21 ENCOUNTER — Encounter: Payer: Self-pay | Admitting: Family Medicine

## 2018-03-21 ENCOUNTER — Ambulatory Visit (INDEPENDENT_AMBULATORY_CARE_PROVIDER_SITE_OTHER): Payer: No Typology Code available for payment source | Admitting: Family Medicine

## 2018-03-21 ENCOUNTER — Other Ambulatory Visit: Payer: Self-pay

## 2018-03-21 VITALS — BP 116/80 | HR 110 | Temp 98.9°F | Resp 17 | Ht 60.5 in | Wt 152.6 lb

## 2018-03-21 DIAGNOSIS — Z79899 Other long term (current) drug therapy: Secondary | ICD-10-CM | POA: Diagnosis not present

## 2018-03-21 DIAGNOSIS — Z124 Encounter for screening for malignant neoplasm of cervix: Secondary | ICD-10-CM | POA: Diagnosis not present

## 2018-03-21 DIAGNOSIS — Z0000 Encounter for general adult medical examination without abnormal findings: Secondary | ICD-10-CM

## 2018-03-21 MED ORDER — VALACYCLOVIR HCL 1 G PO TABS: 2000.0000 mg | ORAL_TABLET | Freq: Every day | ORAL | 1 refills | Status: DC

## 2018-03-21 MED FILL — valACYclovir HCL 1 GM TABS: 1 | 5 days supply | Qty: 10 | Fill #0 | Status: TO

## 2018-03-21 NOTE — Patient Instructions (Signed)
     IF you received an x-ray today, you will receive an invoice from [facility]. Please contact [facility] at [phone] with questions or concerns regarding your invoice.   IF you received labwork today, you will receive an invoice from [facility]. Please contact [facility] at [phone] with questions or concerns regarding your invoice.   Our billing staff will not be able to assist you with questions regarding bills from these companies.  You will be contacted with the lab results as soon as they are available. The fastest way to get your results is to activate your My Chart account. Instructions are located on the last page of this paperwork. If you have not heard from us regarding the results in 2 weeks, please contact this office.     

## 2018-03-21 NOTE — Progress Notes (Signed)
Chief Complaint  Patient presents with  . Annual Exam    with pap, cramping on 15 th of each month with nuvaring, continuous spotting, continuous cold sore ? stress induced.    Subjective:  Kimberly Cooper is a 27 y.o. female here for a health maintenance visit.  Patient is established pt  Patient Active Problem List   Diagnosis Date Noted  . Pelvic pain in female 07/17/2016  . Dyspareunia, female 07/17/2016  . Loose stools 07/17/2016  . Dizziness and giddiness 07/17/2016  . Dysmenorrhea 08/23/2012    Past Medical History:  Diagnosis Date  . Allergy   . Chlamydia   . GERD (gastroesophageal reflux disease)   . Gonorrhea   . MRSA infection greater than 3 months ago 2003   ABDOMINAL CYST    No past surgical history on file.   Outpatient Medications Prior to Visit  Medication Sig Dispense Refill  . ondansetron (ZOFRAN) 4 MG tablet Take 1 tablet (4 mg total) by mouth every 8 (eight) hours as needed for nausea. 20 tablet 0  . valACYclovir (VALTREX) 1000 MG tablet Take 2 tablets (2,000 mg total) by mouth 2 (two) times daily. 4 tablet 0  . albuterol (PROVENTIL HFA;VENTOLIN HFA) 108 (90 Base) MCG/ACT inhaler Inhale 2 puffs into the lungs every 6 (six) hours as needed for wheezing or shortness of breath. 1 Inhaler 0  . benzonatate (TESSALON PERLES) 100 MG capsule Take 1 capsule (100 mg total) 3 (three) times daily as needed by mouth. (Patient not taking: Reported on 01/05/2018) 20 capsule 0  . Dexlansoprazole (DEXILANT) 30 MG capsule Take 1 capsule (30 mg total) by mouth daily. (Patient not taking: Reported on 01/05/2018) 15 capsule 0  . etonogestrel-ethinyl estradiol (NUVARING) 0.12-0.015 MG/24HR vaginal ring Insert vaginally and leave in place for 3 consecutive weeks, then remove for 1 week. (Patient not taking: Reported on 03/21/2018) 1 each 12  . minocycline (DYNACIN) 50 MG tablet Take 1 tablet (50 mg total) by mouth 2 (two) times daily. (Patient not taking: Reported on 01/05/2018) 20  tablet 0  . omeprazole (PRILOSEC) 40 MG capsule Take 1 capsule (40 mg total) by mouth daily. (Patient not taking: Reported on 01/05/2018) 30 capsule 0   No facility-administered medications prior to visit.     Allergies  Allergen Reactions  . Other Hives    All Raw Nuts: Swelling  . Penicillins Hives     Family History  Problem Relation Age of Onset  . Hypertension Mother   . Heart disease Father 83       MI  . Other Father        gunshot, paralysis  . Cancer Maternal Grandfather        lung/prostate  . Cancer Other        GGM has colon cancer, other great aunts and uncles with cancer  . Stroke Maternal Grandmother      Health Habits: Dental Exam: up to date Eye Exam: up to date Exercise:  times/week on average Current exercise activities: walking/running Diet:   Social History   Socioeconomic History  . Marital status: Single    Spouse name: Not on file  . Number of children: Not on file  . Years of education: Not on file  . Highest education level: Not on file  Occupational History  . Not on file  Social Needs  . Financial resource strain: Not on file  . Food insecurity:    Worry: Not on file    Inability: Not  on file  . Transportation needs:    Medical: Not on file    Non-medical: Not on file  Tobacco Use  . Smoking status: Never Smoker  . Smokeless tobacco: Never Used  Substance and Sexual Activity  . Alcohol use: Yes    Alcohol/week: 1.2 oz    Types: 1 Glasses of wine, 1 Cans of beer per week    Comment: social  . Drug use: No  . Sexual activity: Yes    Birth control/protection: None  Lifestyle  . Physical activity:    Days per week: Not on file    Minutes per session: Not on file  . Stress: Not on file  Relationships  . Social connections:    Talks on phone: Not on file    Gets together: Not on file    Attends religious service: Not on file    Active member of club or organization: Not on file    Attends meetings of clubs or  organizations: Not on file    Relationship status: Not on file  . Intimate partner violence:    Fear of current or ex partner: Not on file    Emotionally abused: Not on file    Physically abused: Not on file    Forced sexual activity: Not on file  Other Topics Concern  . Not on file  Social History Narrative  . Not on file   Social History   Substance and Sexual Activity  Alcohol Use Yes  . Alcohol/week: 1.2 oz  . Types: 1 Glasses of wine, 1 Cans of beer per week   Comment: social   Social History   Tobacco Use  Smoking Status Never Smoker  Smokeless Tobacco Never Used   Social History   Substance and Sexual Activity  Drug Use No    GYN: Sexual Health Menstrual status: regular menses LMP: No LMP recorded. Last pap smear: see HM section History of abnormal pap smears:  Sexually active:  with female partner Current contraception: nuvaring (continuous use with breakthrough bleeding)  Health Maintenance: See under health Maintenance activity for review of completion dates as well.  There is no immunization history on file for this patient.    Depression Screen-PHQ2/9 Depression screen [facility] 2/9 03/21/2018 01/05/2018 12/30/2017 05/26/2017  Decreased Interest 0 0 0 0  Down, Depressed, Hopeless 0 0 0 0  PHQ - 2 Score 0 0 0 0       Depression Severity and Treatment Recommendations:  0-4= None  5-9= Mild / Treatment: Support, educate to call if worse; return in one month  10-14= Moderate / Treatment: Support, watchful waiting; Antidepressant or Psycotherapy  15-19= Moderately severe / Treatment: Antidepressant OR Psychotherapy  >= 20 = Major depression, severe / Antidepressant AND Psychotherapy    Review of Systems   Review of Systems  Constitutional: Negative for chills, fever and weight loss.  HENT: Negative for hearing loss and tinnitus.   Respiratory: Negative for cough and shortness of breath.   Cardiovascular: Negative for chest pain and palpitations.   Gastrointestinal: Negative for abdominal pain, constipation, diarrhea, nausea and vomiting.  Genitourinary: Negative for dysuria, frequency and urgency.  Neurological: Negative for dizziness, tingling, tremors and headaches.  Psychiatric/Behavioral: Negative for depression. The patient is not nervous/anxious.     See HPI for ROS as well.    Objective:   Vitals:   03/21/18 1526  BP: 116/80  Pulse: (!) 110  Resp: 17  Temp: 98.9 F (37.2 C)  TempSrc: Oral  Weight:  152 lb 9.6 oz (69.2 kg)  Height: 5' 0.5" (1.537 m)    Body mass index is 29.31 kg/m.  Physical Exam  Constitutional: She is oriented to person, place, and time. She appears well-developed and well-nourished.  HENT:  Head: Normocephalic and atraumatic.  Right Ear: External ear normal.  Left Ear: External ear normal.  Nose: Nose normal.  Mouth/Throat: Oropharynx is clear and moist.  Eyes: Conjunctivae and EOM are normal. Right eye exhibits no discharge. Left eye exhibits no discharge.  Neck: Normal range of motion. Neck supple. No thyromegaly present.  Cardiovascular: Normal rate, regular rhythm and normal heart sounds.  No murmur heard. Pulmonary/Chest: Effort normal and breath sounds normal. No respiratory distress. She has no wheezes. She has no rales.  Abdominal: Soft. Bowel sounds are normal. She exhibits no distension. There is no tenderness. There is no rebound.  Neurological: She is alert and oriented to person, place, and time. She has normal reflexes. No cranial nerve deficit.  Skin: Skin is warm. No rash noted. No erythema.  Psychiatric: She has a normal mood and affect. Her behavior is normal. Judgment and thought content normal.    Chaperone present Breast exam performed Symmetric breasts without nodules or skin changes Pap smear performed, nuvaring visualized, uterus nontender to exam, normal external female genitalia, no CMT or ovarian masses   Assessment/Plan:   Patient was seen for a health  maintenance exam.  Counseled the patient on health maintenance issues. Reviewed her health mainteance schedule and ordered appropriate tests (see orders.) Counseled on regular exercise and weight management. Recommend regular eye exams and dental cleaning.   The following issues were addressed today for health maintenance:   Troyce was seen today for annual exam.  Diagnoses and all orders for this visit:  Routine general medical examination at a health care facility- reviewed labs from 2018 Pt does not need additional labs today Pap smear performed  Pap smear for cervical cancer screening- pap smear performed today -     Pap IG, CT/NG w/ reflex HPV when ASC-U  On Accutane therapy- gets labs monitored by Dermatology  Other orders -     valACYclovir (VALTREX) 1000 MG tablet; Take 2 tablets (2,000 mg total) by mouth daily. For one day. At the first sign of cold sore.    No follow-ups on file.    Body mass index is 29.31 kg/m.:  Discussed the patient's BMI with patient. The BMI body mass index is 29.31 kg/m.     No future appointments.  Patient Instructions       IF you received an x-ray today, you will receive an invoice from [facility]. Please contact [facility] at [phone] with questions or concerns regarding your invoice.   IF you received labwork today, you will receive an invoice from [facility]. Please contact [facility] at [phone] with questions or concerns regarding your invoice.   Our billing staff will not be able to assist you with questions regarding bills from these companies.  You will be contacted with the lab results as soon as they are available. The fastest way to get your results is to activate your My Chart account. Instructions are located on the last page of this paperwork. If you have not heard from [place] regarding the results in 2 weeks, please contact this office.

## 2018-03-23 ENCOUNTER — Telehealth: Payer: Self-pay | Admitting: Family Medicine

## 2018-03-23 LAB — PAP IG, CT-NG, RFX HPV ASCU
CHLAMYDIA, NUC. ACID AMP: NEGATIVE
Gonococcus by Nucleic Acid Amp: NEGATIVE
PAP Smear Comment: 0

## 2018-03-23 NOTE — Telephone Encounter (Signed)
Copied from [place] [id number]. Topic: Quick Communication - See Telephone Encounter >> Mar 23, 2018 12:22 PM Vernona Rieger wrote: CRM for notification. See Telephone encounter for: 03/23/18.  Patient said that she was in the office on Monday 4/1. She was told that her labs would be put into her mychart & they are not. Please advise

## 2018-04-06 MED FILL — MYORISAN 40 MG CAPSULE: 40 | 30 days supply | Qty: 60 | Fill #0

## 2018-04-26 ENCOUNTER — Emergency Department (HOSPITAL_COMMUNITY)
Admission: EM | Admit: 2018-04-26 | Discharge: 2018-04-26 | Discharge status: Against Medical Advice | Payer: No Typology Code available for payment source | Attending: Emergency Medicine | Admitting: Emergency Medicine

## 2018-04-26 DIAGNOSIS — R0789 Other chest pain: Secondary | ICD-10-CM | POA: Diagnosis present

## 2018-04-26 DIAGNOSIS — Z5321 Procedure and treatment not carried out due to patient leaving prior to being seen by health care provider: Secondary | ICD-10-CM | POA: Insufficient documentation

## 2018-04-28 MED FILL — NUVARING VAGINAL RING: 0.12-0.015 | 28 days supply | Qty: 1 | Fill #2

## 2018-05-06 MED FILL — valACYclovir HCL 1 GM TABS: 1 | 5 days supply | Qty: 10 | Fill #0

## 2018-05-06 MED FILL — MYORISAN 40 MG CAPSULE: 40 | 30 days supply | Qty: 90 | Fill #0

## 2018-05-25 ENCOUNTER — Encounter: Payer: Self-pay | Admitting: Family Medicine

## 2018-06-06 MED FILL — HYDROCORTISONE 2.5% OINT: 2.5 | 30 days supply | Qty: 28 | Fill #0

## 2018-06-06 MED FILL — MYORISAN 40 MG CAPSULE: 40 | 30 days supply | Qty: 90 | Fill #0

## 2018-06-06 MED FILL — TRIAMCIN/CERAVE 0.025% 1:1: 30 days supply | Qty: 454 | Fill #0

## 2018-06-06 MED FILL — NUVARING VAGINAL RING: 0.12-0.015 | 84 days supply | Qty: 3 | Fill #3

## 2018-06-07 ENCOUNTER — Other Ambulatory Visit: Payer: Self-pay | Admitting: Family Medicine

## 2018-06-08 ENCOUNTER — Telehealth: Payer: Self-pay | Admitting: Family Medicine

## 2018-06-08 NOTE — Telephone Encounter (Unsigned)
Copied from [place] [id number]. Topic: Quick Communication - See Telephone Encounter >> Jun 08, 2018  5:23 PM Neva Seat wrote: valACYclovir (VALTREX) 1000 MG tablet  Pharmacy needing clarity on directions to fill Rx.  [place], [place] - [place] [address] Phone: [phone] Fax: [phone]

## 2018-06-08 NOTE — Telephone Encounter (Signed)
Refill of Valtrex  LOV 03/21/18  Dr. Nolon Rod  [facility] 03/21/18  #10  1 refill   [place], [place]

## 2018-06-08 NOTE — Telephone Encounter (Signed)
Refill request for valacyclovir 1000 mg #10 with one refill approved. Dgaddy, CMA

## 2018-06-09 NOTE — Telephone Encounter (Signed)
LOV  03/21/18 Dr. Nolon Rod

## 2018-06-14 MED FILL — valACYclovir HCL 1 GM TABS: 1 | 5 days supply | Qty: 10 | Fill #0

## 2018-06-14 NOTE — Telephone Encounter (Signed)
Pharmacy calling because the directions for this valACYclovir (VALTREX) 1000 MG tablet Should be 2 x a day for one day for cold sores. Would like the directions to be changed please and call back today   [place], [place] - [place] [phone] (Phone) [phone] (Fax)

## 2018-06-14 NOTE — Telephone Encounter (Signed)
Spoke with pharmacy clarification given

## 2018-06-30 MED FILL — MYORISAN 40 MG CAPSULE: 40 | 30 days supply | Qty: 60 | Fill #0

## 2018-07-29 ENCOUNTER — Telehealth: Payer: No Typology Code available for payment source | Admitting: Family

## 2018-07-29 DIAGNOSIS — B009 Herpesviral infection, unspecified: Secondary | ICD-10-CM | POA: Diagnosis not present

## 2018-07-29 MED ORDER — VALACYCLOVIR HCL 1 G PO TABS: 2000.0000 mg | ORAL_TABLET | Freq: Two times a day (BID) | ORAL | 0 refills | Status: DC

## 2018-07-29 MED FILL — valACYclovir HCL 1 GM TABS: 1 | 5 days supply | Qty: 20 | Fill #0

## 2018-07-29 NOTE — Progress Notes (Signed)
Thank you for the details you included in the comment boxes. Those details are very helpful in determining the best course of treatment for you and help [place] to provide the best care.  We are sorry that you are not feeling well.  Here is how we plan to help!  Based on what you have shared with me it does look like you have a viral infection.    Most cold sores or fever blisters are small fluid filled blisters around the mouth caused by herpes simplex virus.  The most common strain of the virus causing cold sores is herpes simplex virus 1.  It can be spread by skin contact, sharing eating utensils, or even sharing towels.  Cold sores are contagious to other people until dry. (Approximately 5-7 days).  Wash your hands. You can spread the virus to your eyes through handling your contact lenses after touching the lesions.  Most people experience pain at the sight or tingling sensations in their lips that may begin before the ulcers erupt.  Herpes simplex is treatable but not curable.  It may lie dormant for a long time and then reappear due to stress or prolonged sun exposure.  Many patients have success in treating their cold sores with an over the counter topical called Abreva.  You may apply the cream up to 5 times daily (maximum 10 days) until healing occurs.  If you would like to use an oral antiviral medication to speed the healing of your cold sore, I have sent a prescription to your local pharmacy Valacyclovir 2 gm twice daily for 1 day    HOME CARE:   Wash your hands frequently.  Do not pick at or rub the sore.  Don't open the blisters.  Avoid kissing other people during this time.  Avoid sharing drinking glasses, eating utensils, or razors.  Do not handle contact lenses unless you have thoroughly washed your hands with soap and warm water!  Avoid oral sex during this time.  Herpes from sores on your mouth can spread to your partner's genital area.  Avoid contact with anyone who has  eczema or a weakened immune system.  Cold sores are often triggered by exposure to intense sunlight, use a lip balm containing a sunscreen (SPF 30 or higher).  GET HELP RIGHT AWAY IF:   Blisters look infected.  Blisters occur near or in the eye.  Symptoms last longer than 10 days.  Your symptoms become worse.  MAKE SURE YOU:   Understand these instructions.  Will watch your condition.  Will get help right away if you are not doing well or get worse.    Your e-visit answers were reviewed by a board certified advanced clinical practitioner to complete your personal care plan.  Depending upon the condition, your plan could have  Included both over the counter or prescription medications.    Please review your pharmacy choice.  Be sure that the pharmacy you have chosen is open so that you can pick up your prescription now.  If there is a problem you csn message your provider in [place] to have the prescription routed to another pharmacy.    Your safety is important to [place].  If you have drug allergies check our prescription carefully.  For the next 24 hours you can use MyChart to ask questions about today's visit, request a non-urgent call back, or ask for a work or school excuse from your e-visit provider.  You will get an email in the  next two days asking about your experience.  I hope that your e-visit has been valuable and will speed your recovery.

## 2018-08-01 ENCOUNTER — Telehealth: Payer: Self-pay | Admitting: Family Medicine

## 2018-08-01 DIAGNOSIS — Z79899 Other long term (current) drug therapy: Secondary | ICD-10-CM

## 2018-08-01 DIAGNOSIS — L700 Acne vulgaris: Secondary | ICD-10-CM

## 2018-08-01 MED FILL — MYORISAN 40 MG CAPSULE: 40 | 30 days supply | Qty: 60 | Fill #0

## 2018-08-01 NOTE — Telephone Encounter (Signed)
Called pt and called the focus plan and and the insurance rep stated that pt needs to call them with the dates of services that they paid for without having the correct referral because pt is confused on how they paid for all of her visits to [facility] [place] [facility] Dr. Phillip Heal and they didn't pay for the last 2 visits. Pt is asking for another referral to be placed so they can have another referral on file. Thanks

## 2018-08-02 NOTE — Telephone Encounter (Signed)
I placed referral for dermatology. Thank you.

## 2018-08-05 MED FILL — TRIAMCIN/CERAVE 0.025% 1:1: 30 days supply | Qty: 454 | Fill #1

## 2018-08-08 ENCOUNTER — Telehealth: Payer: No Typology Code available for payment source | Admitting: Family

## 2018-08-08 DIAGNOSIS — J40 Bronchitis, not specified as acute or chronic: Secondary | ICD-10-CM | POA: Diagnosis not present

## 2018-08-08 MED ORDER — PREDNISONE 10 MG (21) PO TBPK: ORAL_TABLET | ORAL | 0 refills | Status: DC

## 2018-08-08 MED FILL — predniSONE 10 MG TABS: 10 | 6 days supply | Qty: 21 | Fill #0

## 2018-08-08 NOTE — Progress Notes (Signed)

## 2018-09-01 ENCOUNTER — Telehealth: Payer: No Typology Code available for payment source | Admitting: Family

## 2018-09-01 DIAGNOSIS — R21 Rash and other nonspecific skin eruption: Secondary | ICD-10-CM | POA: Diagnosis not present

## 2018-09-01 MED ORDER — PREDNISONE 5 MG PO TABS: 5.0000 mg | ORAL_TABLET | ORAL | 0 refills | Status: DC

## 2018-09-01 NOTE — Progress Notes (Signed)
Thank you for the details you included in the comment boxes. Those details are very helpful in determining the best course of treatment for you and help [place] to provide the best care. Given that the rash is on hands/feet areas, it is likely something internal that you have ingested (like food or supplements as well as drinks or medications). The primary treatment for this is prednisone and we rarely find out exactly what the cause is. Regardless, the treatment for this is the same and usually clears up quickly. You can also take Benadryl 25mg  (1 or 2 every 4-6 hours to help). See below.  E Visit for Rash  We are sorry that you are not feeling well. Here is how we plan to help!     Prednisone 5 mg daily for 6 days (see taper instructions below)  Directions for 6 day taper: Day 1: 2 tablets before breakfast, 1 after both lunch & dinner and 2 at bedtime Day 2: 1 tab before breakfast, 1 after both lunch & dinner and 2 at bedtime Day 3: 1 tab at each meal & 1 at bedtime Day 4: 1 tab at breakfast, 1 at lunch, 1 at bedtime Day 5: 1 tab at breakfast & 1 tab at bedtime Day 6: 1 tab at breakfast     HOME CARE:   Take cool showers and avoid direct sunlight.  Apply cool compress or wet dressings.  Take a bath in an oatmeal bath.  Sprinkle content of one Aveeno packet under running faucet with comfortably warm water.  Bathe for 15-20 minutes, 1-2 times daily.  Pat dry with a towel. Do not rub the rash.  Use hydrocortisone cream.  Take an antihistamine like Benadryl for widespread rashes that itch.  The adult dose of Benadryl is 25-50 mg by mouth 4 times daily.  Caution:  This type of medication may cause sleepiness.  Do not drink alcohol, drive, or operate dangerous machinery while taking antihistamines.  Do not take these medications if you have prostate enlargement.  Read package instructions thoroughly on all medications that you take.  GET HELP RIGHT AWAY IF:   Symptoms don't go away  after treatment.  Severe itching that persists.  If you rash spreads or swells.  If you rash begins to smell.  If it blisters and opens or develops a yellow-brown crust.  You develop a fever.  You have a sore throat.  You become short of breath.  MAKE SURE YOU:  Understand these instructions. Will watch your condition. Will get help right away if you are not doing well or get worse.  Thank you for choosing an e-visit. Your e-visit answers were reviewed by a board certified advanced clinical practitioner to complete your personal care plan. Depending upon the condition, your plan could have included both over the counter or prescription medications. Please review your pharmacy choice. Be sure that the pharmacy you have chosen is open so that you can pick up your prescription now.  If there is a problem you may message your provider in [place] to have the prescription routed to another pharmacy. Your safety is important to [place]. If you have drug allergies check your prescription carefully.  For the next 24 hours, you can use MyChart to ask questions about today's visit, request a non-urgent call back, or ask for a work or school excuse from your e-visit provider. You will get an email in the next two days asking about your experience. I hope that your e-visit has  been valuable and will speed your recovery.

## 2018-09-12 MED FILL — NUVARING VAGINAL RING: 0.12-0.015 | 84 days supply | Qty: 3 | Fill #4

## 2018-09-15 ENCOUNTER — Telehealth: Payer: No Typology Code available for payment source | Admitting: Physician Assistant

## 2018-09-15 DIAGNOSIS — B001 Herpesviral vesicular dermatitis: Secondary | ICD-10-CM

## 2018-09-15 MED ORDER — VALACYCLOVIR HCL 1 G PO TABS: 2000.0000 mg | ORAL_TABLET | Freq: Two times a day (BID) | ORAL | 0 refills | Status: DC

## 2018-09-15 MED FILL — valACYclovir HCL 1 GM TABS: 1 | 1 days supply | Qty: 4 | Fill #0

## 2018-09-15 NOTE — Progress Notes (Signed)
We are sorry that you are not feeling well.  Here is how we plan to help!  Based on what you have shared with me it does look like you have a viral infection.    Most cold sores or fever blisters are small fluid filled blisters around the mouth caused by herpes simplex virus.  The most common strain of the virus causing cold sores is herpes simplex virus 1.  It can be spread by skin contact, sharing eating utensils, or even sharing towels.  Cold sores are contagious to other people until dry. (Approximately 5-7 days).  Wash your hands. You can spread the virus to your eyes through handling your contact lenses after touching the lesions.  Most people experience pain at the sight or tingling sensations in their lips that may begin before the ulcers erupt.  Herpes simplex is treatable but not curable.  It may lie dormant for a long time and then reappear due to stress or prolonged sun exposure.  Many patients have success in treating their cold sores with an over the counter topical called Abreva.  You may apply the cream up to 5 times daily (maximum 10 days) until healing occurs.  If you would like to use an oral antiviral medication to speed the healing of your cold sore, I have sent a prescription to your local pharmacy Valacyclovir 2 gm twice daily for 1 day    HOME CARE:   Wash your hands frequently.  Do not pick at or rub the sore.  Don't open the blisters.  Avoid kissing other people during this time.  Avoid sharing drinking glasses, eating utensils, or razors.  Do not handle contact lenses unless you have thoroughly washed your hands with soap and warm water!  Avoid oral sex during this time.  Herpes from sores on your mouth can spread to your partner's genital area.  Avoid contact with anyone who has eczema or a weakened immune system.  Cold sores are often triggered by exposure to intense sunlight, use a lip balm containing a sunscreen (SPF 30 or higher).  GET HELP RIGHT AWAY  IF:   Blisters look infected.  Blisters occur near or in the eye.  Symptoms last longer than 10 days.  Your symptoms become worse.  MAKE SURE YOU:   Understand these instructions.  Will watch your condition.  Will get help right away if you are not doing well or get worse.    Your e-visit answers were reviewed by a board certified advanced clinical practitioner to complete your personal care plan.  Depending upon the condition, your plan could have  Included both over the counter or prescription medications.    Please review your pharmacy choice.  Be sure that the pharmacy you have chosen is open so that you can pick up your prescription now.  If there is a problem you can message your provider in MyChart to have the prescription routed to another pharmacy.    Your safety is important to us.  If you have drug allergies check our prescription carefully.  For the next 24 hours you can use MyChart to ask questions about today's visit, request a non-urgent call back, or ask for a work or school excuse from your e-visit provider.  You will get an email in the next two days asking about your experience.  I hope that your e-visit has been valuable and will speed your recovery.  

## 2018-10-04 ENCOUNTER — Telehealth: Payer: No Typology Code available for payment source | Admitting: Physician Assistant

## 2018-10-04 DIAGNOSIS — B001 Herpesviral vesicular dermatitis: Secondary | ICD-10-CM | POA: Diagnosis not present

## 2018-10-04 MED ORDER — VALACYCLOVIR HCL 1 G PO TABS: 2000.0000 mg | ORAL_TABLET | Freq: Once | ORAL | 0 refills | Status: AC

## 2018-10-04 MED FILL — valACYclovir HCL 1 GM TABS: 1 | 1 days supply | Qty: 2 | Fill #0

## 2018-10-04 NOTE — Progress Notes (Signed)
We are sorry that you are not feeling well.  Here is how we plan to help!  Based on what you have shared with me it does look like you have a viral infection.    Most cold sores or fever blisters are small fluid filled blisters around the mouth caused by herpes simplex virus.  The most common strain of the virus causing cold sores is herpes simplex virus 1.  It can be spread by skin contact, sharing eating utensils, or even sharing towels.  Cold sores are contagious to other people until dry. (Approximately 5-7 days).  Wash your hands. You can spread the virus to your eyes through handling your contact lenses after touching the lesions.  Most people experience pain at the sight or tingling sensations in their lips that may begin before the ulcers erupt.  Herpes simplex is treatable but not curable.  It may lie dormant for a long time and then reappear due to stress or prolonged sun exposure.  Many patients have success in treating their cold sores with an over the counter topical called Abreva.  You may apply the cream up to 5 times daily (maximum 10 days) until healing occurs.  If you would like to use an oral antiviral medication to speed the healing of your cold sore, I have sent a prescription to your local pharmacy Valacyclovir 2 gm twice daily for 1 day    HOME CARE:   Wash your hands frequently.  Do not pick at or rub the sore.  Don't open the blisters.  Avoid kissing other people during this time.  Avoid sharing drinking glasses, eating utensils, or razors.  Do not handle contact lenses unless you have thoroughly washed your hands with soap and warm water!  Avoid oral sex during this time.  Herpes from sores on your mouth can spread to your partner's genital area.  Avoid contact with anyone who has eczema or a weakened immune system.  Cold sores are often triggered by exposure to intense sunlight, use a lip balm containing a sunscreen (SPF 30 or higher).  GET HELP RIGHT AWAY  IF:   Blisters look infected.  Blisters occur near or in the eye.  Symptoms last longer than 10 days.  Your symptoms become worse.  MAKE SURE YOU:   Understand these instructions.  Will watch your condition.  Will get help right away if you are not doing well or get worse.    Your e-visit answers were reviewed by a board certified advanced clinical practitioner to complete your personal care plan.  Depending upon the condition, your plan could have  Included both over the counter or prescription medications.    Please review your pharmacy choice.  Be sure that the pharmacy you have chosen is open so that you can pick up your prescription now.  If there is a problem you can message your provider in MyChart to have the prescription routed to another pharmacy.    Your safety is important to us.  If you have drug allergies check our prescription carefully.  For the next 24 hours you can use MyChart to ask questions about today's visit, request a non-urgent call back, or ask for a work or school excuse from your e-visit provider.  You will get an email in the next two days asking about your experience.  I hope that your e-visit has been valuable and will speed your recovery.  

## 2018-10-13 ENCOUNTER — Telehealth: Payer: No Typology Code available for payment source | Admitting: Family

## 2018-10-13 DIAGNOSIS — B001 Herpesviral vesicular dermatitis: Secondary | ICD-10-CM

## 2018-10-13 MED ORDER — VALACYCLOVIR HCL 1 G PO TABS: 2000.0000 mg | ORAL_TABLET | Freq: Two times a day (BID) | ORAL | 0 refills | Status: DC

## 2018-10-13 MED FILL — valACYclovir HCL 1 GM TABS: 1 | 1 days supply | Qty: 4 | Fill #0

## 2018-10-13 NOTE — Progress Notes (Signed)
We are sorry that you are not feeling well.  Here is how we plan to help!  Based on what you have shared with me it does look like you have a viral infection.    Most cold sores or fever blisters are small fluid filled blisters around the mouth caused by herpes simplex virus.  The most common strain of the virus causing cold sores is herpes simplex virus 1.  It can be spread by skin contact, sharing eating utensils, or even sharing towels.  Cold sores are contagious to other people until dry. (Approximately 5-7 days).  Wash your hands. You can spread the virus to your eyes through handling your contact lenses after touching the lesions.  Most people experience pain at the sight or tingling sensations in their lips that may begin before the ulcers erupt.  Herpes simplex is treatable but not curable.  It may lie dormant for a long time and then reappear due to stress or prolonged sun exposure.  Many patients have success in treating their cold sores with an over the counter topical called Abreva.  You may apply the cream up to 5 times daily (maximum 10 days) until healing occurs.  If you would like to use an oral antiviral medication to speed the healing of your cold sore, I have sent a prescription to your local pharmacy Valacyclovir 2 gm twice daily for 1 day    HOME CARE:   Wash your hands frequently.  Do not pick at or rub the sore.  Don't open the blisters.  Avoid kissing other people during this time.  Avoid sharing drinking glasses, eating utensils, or razors.  Do not handle contact lenses unless you have thoroughly washed your hands with soap and warm water!  Avoid oral sex during this time.  Herpes from sores on your mouth can spread to your partner's genital area.  Avoid contact with anyone who has eczema or a weakened immune system.  Cold sores are often triggered by exposure to intense sunlight, use a lip balm containing a sunscreen (SPF 30 or higher).  GET HELP RIGHT AWAY  IF:   Blisters look infected.  Blisters occur near or in the eye.  Symptoms last longer than 10 days.  Your symptoms become worse.  MAKE SURE YOU:   Understand these instructions.  Will watch your condition.  Will get help right away if you are not doing well or get worse.    Your e-visit answers were reviewed by a board certified advanced clinical practitioner to complete your personal care plan.  Depending upon the condition, your plan could have  Included both over the counter or prescription medications.    Please review your pharmacy choice.  Be sure that the pharmacy you have chosen is open so that you can pick up your prescription now.  If there is a problem you can message your provider in MyChart to have the prescription routed to another pharmacy.    Your safety is important to us.  If you have drug allergies check our prescription carefully.  For the next 24 hours you can use MyChart to ask questions about today's visit, request a non-urgent call back, or ask for a work or school excuse from your e-visit provider.  You will get an email in the next two days asking about your experience.  I hope that your e-visit has been valuable and will speed your recovery.  

## 2018-10-24 ENCOUNTER — Ambulatory Visit (INDEPENDENT_AMBULATORY_CARE_PROVIDER_SITE_OTHER): Payer: No Typology Code available for payment source | Admitting: Family Medicine

## 2018-10-24 ENCOUNTER — Other Ambulatory Visit: Payer: Self-pay

## 2018-10-24 VITALS — BP 116/79 | HR 76 | Temp 98.6°F | Resp 17 | Ht 60.0 in | Wt 153.0 lb

## 2018-10-24 DIAGNOSIS — R002 Palpitations: Secondary | ICD-10-CM | POA: Diagnosis not present

## 2018-10-24 LAB — POCT URINALYSIS DIP (MANUAL ENTRY)
Bilirubin, UA: NEGATIVE
Blood, UA: NEGATIVE
Glucose, UA: NEGATIVE mg/dL
Ketones, POC UA: NEGATIVE mg/dL
Nitrite, UA: NEGATIVE
PH UA: 7 (ref 5.0–8.0)
Protein Ur, POC: NEGATIVE mg/dL
Spec Grav, UA: 1.015 (ref 1.010–1.025)
Urobilinogen, UA: 1 E.U./dL

## 2018-10-24 NOTE — Patient Instructions (Signed)
° ° ° °  If you have lab work done today you will be contacted with your lab results within the next 2 weeks.  If you have not heard from us then please contact us. The fastest way to get your results is to register for My Chart. ° ° °IF you received an x-ray today, you will receive an invoice from [facility]. Please contact [facility] at [phone] with questions or concerns regarding your invoice.  ° °IF you received labwork today, you will receive an invoice from [facility]. Please contact [facility] at [phone] with questions or concerns regarding your invoice.  ° °Our billing staff will not be able to assist you with questions regarding bills from these companies. ° °You will be contacted with the lab results as soon as they are available. The fastest way to get your results is to activate your My Chart account. Instructions are located on the last page of this paperwork. If you have not heard from us regarding the results in 2 weeks, please contact this office. °  ° ° ° °

## 2018-10-24 NOTE — Progress Notes (Signed)
Chief Complaint  Patient presents with  . cough and sweat    intermittent x 1 week, nausea and indigestion but not vomitting. Per pt she feels crappy almost like the flu.  Night sweats really bad, real nauseous, appetite ok only chicken broth today and breakfast, per pt she just feels really nauseous.    HPI  She reports that she feels like she had indigestion and nausea since 3am She is on her contraception She was being monitored for acutane She feels sick with night sweats, fevers and chills She has poor appetite She has a cough that was previously productive but is now dry She denies wheezing or shortness of breath She received a seasonal flu shot  Past Medical History:  Diagnosis Date  . Allergy   . Chlamydia   . GERD (gastroesophageal reflux disease)   . Gonorrhea   . MRSA infection greater than 3 months ago 2003   ABDOMINAL CYST    Current Outpatient Medications  Medication Sig Dispense Refill  . etonogestrel-ethinyl estradiol (NUVARING) 0.12-0.015 MG/24HR vaginal ring Insert vaginally and leave in place for 3 consecutive weeks, then remove for 1 week. 1 each 12  . ondansetron (ZOFRAN) 4 MG tablet Take 1 tablet (4 mg total) by mouth every 8 (eight) hours as needed for nausea. 20 tablet 0  . predniSONE (DELTASONE) 5 MG tablet Take 1 tablet (5 mg total) by mouth as directed. Taper 6,5,4,3,2,1 21 tablet 0  . valACYclovir (VALTREX) 1000 MG tablet Take 2 tablets (2,000 mg total) by mouth 2 (two) times daily. 4 tablet 0  . albuterol (PROVENTIL HFA;VENTOLIN HFA) 108 (90 Base) MCG/ACT inhaler Inhale 2 puffs into the lungs every 6 (six) hours as needed for wheezing or shortness of breath. 1 Inhaler 0   No current facility-administered medications for this visit.     Allergies:  Allergies  Allergen Reactions  . Other Hives    All Raw Nuts: Swelling  . Penicillins Hives    No past surgical history on file.  Social History   Socioeconomic History  . Marital status:  Single    Spouse name: Not on file  . Number of children: Not on file  . Years of education: Not on file  . Highest education level: Not on file  Occupational History  . Not on file  Social Needs  . Financial resource strain: Not on file  . Food insecurity:    Worry: Not on file    Inability: Not on file  . Transportation needs:    Medical: Not on file    Non-medical: Not on file  Tobacco Use  . Smoking status: Never Smoker  . Smokeless tobacco: Never Used  Substance and Sexual Activity  . Alcohol use: Yes    Alcohol/week: 2.0 standard drinks    Types: 1 Glasses of wine, 1 Cans of beer per week    Comment: social  . Drug use: No  . Sexual activity: Yes    Birth control/protection: None  Lifestyle  . Physical activity:    Days per week: Not on file    Minutes per session: Not on file  . Stress: Not on file  Relationships  . Social connections:    Talks on phone: Not on file    Gets together: Not on file    Attends religious service: Not on file    Active member of club or organization: Not on file    Attends meetings of clubs or organizations: Not on file  Relationship status: Not on file  Other Topics Concern  . Not on file  Social History Narrative  . Not on file    Family History  Problem Relation Age of Onset  . Hypertension Mother   . Heart disease Father 36       MI  . Other Father        gunshot, paralysis  . Cancer Maternal Grandfather        lung/prostate  . Cancer Other        GGM has colon cancer, other great aunts and uncles with cancer  . Stroke Maternal Grandmother      ROS Review of Systems See HPI Constitution: No fevers or chills No malaise No diaphoresis Skin: No rash or itching Eyes: no blurry vision, no double vision GU: no dysuria or hematuria Neuro: no dizziness or headaches all others reviewed and negative   Objective: Vitals:   10/24/18 1403  BP: 116/79  Pulse: 76  Resp: 17  Temp: 98.6 F (37 C)  TempSrc: Oral    SpO2: 99%  Weight: 153 lb (69.4 kg)  Height: 5' (1.524 m)    Physical Exam Physical Exam  Constitutional: She is oriented to person, place, and time. She appears well-developed and well-nourished.  HENT:  Head: Normocephalic and atraumatic.  Eyes: Conjunctivae and EOM are normal.  Cardiovascular: Normal rate, regular rhythm and normal heart sounds.   Pulmonary/Chest: Effort normal and breath sounds normal. No respiratory distress. She has no wheezes.  Abdominal: Normal appearance and bowel sounds are normal. There is no tenderness. There is no CVA tenderness.  Neurological: She is alert and oriented to person, place, and time.   ECG- NSR UA- no LE/Nitrites   Assessment and Plan Idaly was seen today for cough and sweat.  Diagnoses and all orders for this visit:  Palpitations -     EKG 12-Lead -     POCT urinalysis dipstick   Discussed hydration Her ecg is normal Discussed that her urine is very concentrated Will do further work up if symptoms persists   [place]

## 2018-10-28 ENCOUNTER — Encounter: Payer: Self-pay | Admitting: Family Medicine

## 2018-11-01 ENCOUNTER — Telehealth: Payer: Self-pay | Admitting: Family Medicine

## 2018-11-01 NOTE — Telephone Encounter (Signed)
Copied from [place] [id number]. Topic: General - Inquiry >> Nov 01, 2018  1:03 PM Virl Axe D wrote: Reason for CRM: Pt wanted to tell Dr. Nolon Rod she has started to have vaginal bleeding again and took the NuvaRing out. She has concerns of her cervix bleeding more than normal and feels like she never got adjusted to the NuvaRing. She would like to speak with Dr. Nolon Rod regarding this. Pt sent over a MyChart message on 10/28/18 as well. Please advise Cb#[phone]

## 2018-11-03 NOTE — Telephone Encounter (Signed)
Please add this patient to my next same day if the patient is agreeable.   Dr. Nolon Rod

## 2018-11-03 NOTE — Telephone Encounter (Signed)
Please see note below. 

## 2018-11-04 NOTE — Telephone Encounter (Signed)
Pt returned call and I offered to make her an appt for Monday 11/18. Pt stated that she could not come in until Thursday. I advised that Nolon Rod was not here on Thursday. I advised that she could call in on Tuesday for a Wednesday appt or Friday for a Saturday appt. Pt stated that she was not happy about the way that things are done in this office and stated that she felt we were forcing her to find a new Doctor.   I advised that I understood her position and I would  be happy to get her an appt that fit into her schedule. Pt stated that she would have to have a nopte before the appt saying that Dr. Nolon Rod had to see her in the office before her Director would let her off.   Pt ended the call with stating that she would call in on Friday for a Saturday appt.

## 2018-11-04 NOTE — Telephone Encounter (Signed)
LVM for pt to call the office and schedule on Dr. Nolon Rod NEXT SAME DAY that is convenient for the pt. When pt calls back, please schedule her on the [place] (call [place])   Thank you!

## 2018-11-12 ENCOUNTER — Ambulatory Visit: Payer: Self-pay | Admitting: Family Medicine

## 2018-11-24 ENCOUNTER — Telehealth: Payer: Self-pay | Admitting: Medical

## 2018-11-24 ENCOUNTER — Telehealth: Payer: Self-pay | Admitting: Family Medicine

## 2018-11-24 NOTE — Telephone Encounter (Signed)
error 

## 2018-11-24 NOTE — Progress Notes (Signed)
Chief Complaint  Patient presents with  . pt wants to make sure she is ok, concerns about break thru b  . Medication Refill    valtrex    HPI   Patient reports that she was having heavy menstrual bleeding for the first 2 days during a 5 day menstrual cycle  She reports that she has pulled her nuvaring 2 weeks She reports that she stopped the nuvaring and was only on it for Accutane  She reports that she started googling and saw stuff about cancer. She had pain with intercourse when her husband would hit her cervix  This would cause her to have pain  Past Medical History:  Diagnosis Date  . Allergy   . Chlamydia   . GERD (gastroesophageal reflux disease)   . Gonorrhea   . MRSA infection greater than 3 months ago 2003   ABDOMINAL CYST    Current Outpatient Medications  Medication Sig Dispense Refill  . valACYclovir (VALTREX) 1000 MG tablet Take 2 tablets (2,000 mg total) by mouth 2 (two) times daily. 4 tablet 0  . albuterol (PROVENTIL HFA;VENTOLIN HFA) 108 (90 Base) MCG/ACT inhaler Inhale 2 puffs into the lungs every 6 (six) hours as needed for wheezing or shortness of breath. 1 Inhaler 0   No current facility-administered medications for this visit.     Allergies:  Allergies  Allergen Reactions  . Other Hives    All Raw Nuts: Swelling  . Penicillins Hives    No past surgical history on file.  Social History   Socioeconomic History  . Marital status: Single    Spouse name: Not on file  . Number of children: Not on file  . Years of education: Not on file  . Highest education level: Not on file  Occupational History  . Not on file  Social Needs  . Financial resource strain: Not on file  . Food insecurity:    Worry: Not on file    Inability: Not on file  . Transportation needs:    Medical: Not on file    Non-medical: Not on file  Tobacco Use  . Smoking status: Never Smoker  . Smokeless tobacco: Never Used  Substance and Sexual Activity  . Alcohol use:  Yes    Alcohol/week: 2.0 standard drinks    Types: 1 Glasses of wine, 1 Cans of beer per week    Comment: social  . Drug use: No  . Sexual activity: Yes    Birth control/protection: None  Lifestyle  . Physical activity:    Days per week: Not on file    Minutes per session: Not on file  . Stress: Not on file  Relationships  . Social connections:    Talks on phone: Not on file    Gets together: Not on file    Attends religious service: Not on file    Active member of club or organization: Not on file    Attends meetings of clubs or organizations: Not on file    Relationship status: Not on file  Other Topics Concern  . Not on file  Social History Narrative  . Not on file    Family History  Problem Relation Age of Onset  . Hypertension Mother   . Heart disease Father 42       MI  . Other Father        gunshot, paralysis  . Cancer Maternal Grandfather        lung/prostate  . Stroke Maternal Grandmother   .  Cancer Paternal Aunt 32       stomach cancer     ROS Review of Systems See HPI Constitution: No fevers or chills No malaise No diaphoresis Skin: No rash or itching Eyes: no blurry vision, no double vision GU: no dysuria or hematuria Neuro: no dizziness or headaches  all others reviewed and negative   Objective: Vitals:   11/25/18 1330  BP: 126/82  Pulse: 97  Resp: 17  Temp: 98.3 F (36.8 C)  TempSrc: Oral  SpO2: 99%  Weight: 155 lb 3.2 oz (70.4 kg)  Height: 5' (1.524 m)    Physical Exam Physical Exam  Constitutional: Oriented to person, place, and time. Appears well-developed and well-nourished.  HENT:  Head: Normocephalic and atraumatic.  Eyes: Conjunctivae and EOM are normal.  Cardiovascular: Normal rate, regular rhythm, normal heart sounds and intact distal pulses.  No murmur heard. Pulmonary/Chest: Effort normal and breath sounds normal. No stridor. No respiratory distress. Has no wheezes.  Neurological: Is alert and oriented to person,  place, and time.  Skin: Skin is warm. Capillary refill takes less than 2 seconds.  Psychiatric: Has a normal mood and affect. Behavior is normal. Judgment and thought content normal.   Assessment and Plan Kimberly Cooper was seen today for pt wants to make sure she is ok, concerns about break thru b and medication refill.  Diagnoses and all orders for this visit:  Worried well- provided reassurance   Breakthrough bleeding with NuvaRing- pt discontinued nuvaring and will use condoms      [place]

## 2018-11-24 NOTE — Telephone Encounter (Signed)
Dismissal letter in [employer] snapshot  °

## 2018-11-25 ENCOUNTER — Ambulatory Visit (INDEPENDENT_AMBULATORY_CARE_PROVIDER_SITE_OTHER): Payer: No Typology Code available for payment source | Admitting: Family Medicine

## 2018-11-25 ENCOUNTER — Other Ambulatory Visit: Payer: Self-pay

## 2018-11-25 ENCOUNTER — Encounter: Payer: Self-pay | Admitting: Family Medicine

## 2018-11-25 VITALS — BP 126/82 | HR 97 | Temp 98.3°F | Resp 17 | Ht 60.0 in | Wt 155.2 lb

## 2018-11-25 DIAGNOSIS — Z975 Presence of (intrauterine) contraceptive device: Secondary | ICD-10-CM

## 2018-11-25 DIAGNOSIS — N921 Excessive and frequent menstruation with irregular cycle: Secondary | ICD-10-CM | POA: Diagnosis not present

## 2018-11-25 DIAGNOSIS — Z711 Person with feared health complaint in whom no diagnosis is made: Secondary | ICD-10-CM | POA: Diagnosis not present

## 2018-11-25 NOTE — Patient Instructions (Signed)
° ° ° °  If you have lab work done today you will be contacted with your lab results within the next 2 weeks.  If you have not heard from us then please contact us. The fastest way to get your results is to register for My Chart. ° ° °IF you received an x-ray today, you will receive an invoice from [facility]. Please contact [facility] at [phone] with questions or concerns regarding your invoice.  ° °IF you received labwork today, you will receive an invoice from [facility]. Please contact [facility] at [phone] with questions or concerns regarding your invoice.  ° °Our billing staff will not be able to assist you with questions regarding bills from these companies. ° °You will be contacted with the lab results as soon as they are available. The fastest way to get your results is to activate your My Chart account. Instructions are located on the last page of this paperwork. If you have not heard from us regarding the results in 2 weeks, please contact this office. °  ° ° ° °

## 2018-11-26 ENCOUNTER — Encounter: Payer: Self-pay | Admitting: Family Medicine

## 2018-12-19 ENCOUNTER — Telehealth: Payer: No Typology Code available for payment source | Admitting: Family

## 2018-12-19 DIAGNOSIS — J111 Influenza due to unidentified influenza virus with other respiratory manifestations: Secondary | ICD-10-CM | POA: Diagnosis not present

## 2018-12-19 MED ORDER — OSELTAMIVIR PHOSPHATE 75 MG PO CAPS: 75.0000 mg | ORAL_CAPSULE | Freq: Two times a day (BID) | ORAL | 0 refills | Status: DC

## 2018-12-19 NOTE — Progress Notes (Signed)
Thank you for the details you included in the comment boxes. Those details are very helpful in determining the best course of treatment for you and help [place] to provide the best care. Given your role at [facility] and your exposure to sick patients and indication of sick contacts, see plan below.  E visit for Flu like symptoms   We are sorry that you are not feeling well.  Here is how we plan to help! Based on what you have shared with me it looks like you may have possible exposure to a virus that causes influenza.  Influenza or "the flu" is   an infection caused by a respiratory virus. The flu virus is highly contagious and persons who did not receive their yearly flu vaccination may "catch" the flu from close contact.  We have anti-viral medications to treat the viruses that cause this infection. They are not a "cure" and only shorten the course of the infection. These prescriptions are most effective when they are given within the first 2 days of "flu" symptoms. Antiviral medication are indicated if you have a high risk of complications from the flu. You should  also consider an antiviral medication if you are in close contact with someone who is at risk. These medications can help patients avoid complications from the flu  but have side effects that you should know. Possible side effects from Tamiflu or oseltamivir include nausea, vomiting, diarrhea, dizziness, headaches, eye redness, sleep problems or other respiratory symptoms. You should not take Tamiflu if you have an allergy to oseltamivir or any to the ingredients in Tamiflu.  Based upon your symptoms and potential risk factors I have prescribed Oseltamivir (Tamiflu).  It has been sent to your designated pharmacy.  You will take one 75 mg capsule orally twice a day for the next 5 days.  ANYONE WHO HAS FLU SYMPTOMS SHOULD: . Stay home. The flu is highly contagious and going out or to work exposes others! . Be sure to drink plenty of fluids.  Water is fine as well as fruit juices, sodas and electrolyte beverages. You may want to stay away from caffeine or alcohol. If you are nauseated, try taking small sips of liquids. How do you know if you are getting enough fluid? Your urine should be a pale yellow or almost colorless. . Get rest. . Taking a steamy shower or using a humidifier may help nasal congestion and ease sore throat pain. Using a saline nasal spray works much the same way. . Cough drops, hard candies and sore throat lozenges may ease your cough. . Line up a caregiver. Have someone check on you regularly.   GET HELP RIGHT AWAY IF: . You cannot keep down liquids or your medications. . You become short of breath . Your fell like you are going to pass out or loose consciousness. . Your symptoms persist after you have completed your treatment plan MAKE SURE YOU   Understand these instructions.  Will watch your condition.  Will get help right away if you are not doing well or get worse.  Your e-visit answers were reviewed by a board certified advanced clinical practitioner to complete your personal care plan.  Depending on the condition, your plan could have included both over the counter or prescription medications.  If there is a problem please reply  once you have received a response from your provider.  Your safety is important to [place].  If you have drug allergies check your prescription carefully.  You can use MyChart to ask questions about today's visit, request a non-urgent call back, or ask for a work or school excuse for 24 hours related to this e-Visit. If it has been greater than 24 hours you will need to follow up with your provider, or enter a new e-Visit to address those concerns.  You will get an e-mail in the next two days asking about your experience.  I hope that your e-visit has been valuable and will speed your recovery. Thank you for using e-visits.

## 2019-01-24 ENCOUNTER — Encounter: Payer: No Typology Code available for payment source | Admitting: Family Medicine

## 2019-02-08 ENCOUNTER — Telehealth: Payer: No Typology Code available for payment source | Admitting: Family

## 2019-02-08 DIAGNOSIS — B002 Herpesviral gingivostomatitis and pharyngotonsillitis: Secondary | ICD-10-CM

## 2019-02-08 MED ORDER — VALACYCLOVIR HCL 1 G PO TABS: 2000.0000 mg | ORAL_TABLET | Freq: Two times a day (BID) | ORAL | 0 refills | Status: DC

## 2019-02-08 MED FILL — valACYclovir HCL 1 GM TABS: 1 | 1 days supply | Qty: 4 | Fill #0

## 2019-02-08 NOTE — Progress Notes (Signed)
We are sorry that you are not feeling well.  Here is how we plan to help!  Based on what you have shared with me it looks like you have a Cold Sore. A cold sore (fever blister) is a skin infection caused by the herpes simplex virus (HSV-1). HSV-1 is closely related to the virus that causes genital herpes (HSV-2), but they are not the same even though both viruses can cause oral and genital infections. Cold sores are small, fluid-filled sores inside of the mouth or on the lips, gums, nose, chin, cheeks, or fingers.   The herpes simplex virus can be easily passed (contagious) to other people through close personal contact, such as kissing or sharing personal items. The virus can also spread to other parts of the body, such as the eyes or genitals. Cold sores are contagious until the sores crust over completely. They often heal within 2 weeks. Once a person is infected, the herpes simplex virus remains permanently in the body. Therefore, there is no cure for cold sores, and they often recur when a person is tired, stressed, sick, or gets too much sun. Additional factors that can cause a recurrence include hormone changes in menstruation or pregnancy, certain drugs, and cold weather.  CAUSES  Cold sores are caused by the herpes simplex virus. The virus is spread from person to person through close contact, such as through kissing, touching the affected area, or sharing personal items such as lip balm, razors, or eating utensils.   I have sent in a prescription to your pharmacy of Valacyclovir, or Valtrex, 2000 mg twice a day for 1 day.   Approximately  5 minutes spent documenting and reviewing patient's chart.   HOME CARE INSTRUCTIONS   Only take over-the-counter or prescription medicines for pain, discomfort, or fever as directed by your caregiver. Do not use aspirin.   Use a cotton-tip swab to apply creams or gels to your sores.   Do not touch the sores or pick the scabs. Wash your hands often. Do  not touch your eyes without washing your hands first.   Avoid kissing, oral sex, and sharing personal items until sores heal.   Apply an ice pack on your sores for 10-15 minutes to ease any discomfort.   Avoid hot, cold, or salty foods because they may hurt your mouth. Eat a soft, bland diet to avoid irritating the sores. Use a straw to drink if you have pain when drinking out of a glass.   Keep sores clean and dry to prevent an infection of other tissues.   Avoid the sun and limit stress if these things trigger outbreaks. If sun causes cold sores, apply sunscreen on the lips before being out in the sun.  SEEK MEDICAL CARE IF:   You have a fever or persistent symptoms for more than 2-3 days.   You have a fever and your symptoms suddenly get worse.   You have pus, not clear fluid, coming from the sores.   You have redness that is spreading.   You have pain or irritation in your eye.   You get sores on your genitals.   Your sores do not heal within 2 weeks.   You have a weakened immune system.   You have frequent recurrences of cold sores.    MAKE SURE YOU   Understand these instructions.  Will watch your condition.  Will get help right away if you are not doing well or get worse.  Your e-visit answers  were reviewed by a board certified advanced clinical practitioner to complete your personal care plan.  Depending on the condition, your plan could have included both over the counter or prescription medications.  If there is a problem please reply  once you have received a response from your provider.  Your safety is important to [place].  If you have drug allergies check your prescription carefully.    You can use MyChart to ask questions about today's visit, request a non-urgent call back, or ask for a work or school excuse.  You will get an e-mail in the next two days asking about your experience.  I hope that your e-visit has been valuable and will speed  your recovery. Thank you for using e-visits.

## 2019-02-27 ENCOUNTER — Other Ambulatory Visit: Payer: Self-pay | Admitting: Family Medicine

## 2019-02-27 ENCOUNTER — Telehealth: Payer: No Typology Code available for payment source | Admitting: Physician Assistant

## 2019-02-27 ENCOUNTER — Ambulatory Visit: Payer: Self-pay

## 2019-02-27 ENCOUNTER — Encounter: Payer: Self-pay | Admitting: Physician Assistant

## 2019-02-27 DIAGNOSIS — B002 Herpesviral gingivostomatitis and pharyngotonsillitis: Secondary | ICD-10-CM

## 2019-02-27 MED ORDER — VALACYCLOVIR HCL 1 G PO TABS: 2000.0000 mg | ORAL_TABLET | Freq: Two times a day (BID) | ORAL | 0 refills | Status: DC

## 2019-02-27 MED FILL — valACYclovir HCL 1 GM TABS: 1 | 1 days supply | Qty: 4 | Fill #0

## 2019-02-27 NOTE — Telephone Encounter (Signed)
Copied from [place] [phone]. Topic: Quick Communication - Rx Refill/Question >> Feb 27, 2019  8:55 AM Burchel, Abbi R wrote: Medication: valACYclovir (VALTREX) 1000 MG tablet   Preferred [facility], [address] [address] Phone: [phone] Fax: [phone]   Pt was advised that RX refills may take up to 3 business days. We ask that you follow-up with your pharmacy.

## 2019-02-27 NOTE — Telephone Encounter (Signed)
Called pt and left message to call back.

## 2019-02-27 NOTE — Telephone Encounter (Signed)
Pt. returned call to request Dr. Nolon Rod to order Rx for ValAcyclovir.  Reported had an E-visit today for cold sore, and was recommended only 4 tablets, total.  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  Stated she feels that taking Accutane previously, affected her system, causing the frequent outbreaks/ recurrences of cold sores.  Stated she spoke with Dr. Nolon Rod about this, and thought there was going to be an Rx ordered.  Stated she did not pick up the prescription today that was ordered by the PA that did her E-Visit.  The pt. Is requesting Dr. Nolon Rod or her nurse to send her a message via [place], or leave a message on her cell phone.  Advised will make the office aware of her request.      Reason for Disposition . Caller requesting a NON-URGENT new prescription or refill and triager unable to refill per unit policy  Answer Assessment - Initial Assessment Questions 1. SYMPTOMS: "Do you have any symptoms?"     Cold sore 2. SEVERITY: If symptoms are present, ask "Are they mild, moderate or severe?"     Verb. frequent recurrence of cold sores; requesting more than 4 tablets for treatment; is requesting a maintenance dose.  Protocols used: MEDICATION QUESTION CALL-A-AH

## 2019-02-27 NOTE — Telephone Encounter (Signed)
Called pt to assess symptoms- pt was just prescribed med 02/08/19 need to assess symptoms pr see if needs to be evaluated by her provider.

## 2019-02-27 NOTE — Progress Notes (Signed)
We are sorry that you are not feeling well.  Here is how we plan to help!  Based on what you have shared with me it does look like you have a viral infection.    Ms. Syverson, as per our phone conversation, this outbreak is new and is not related to previous one you had in this year. Follow up with your PCP for further management of these frequent outbreaks. If you develop any new symptoms, or any worsening, go to an urgent care or follow up with your PCP  Most cold sores or fever blisters are small fluid filled blisters around the mouth caused by herpes simplex virus.  The most common strain of the virus causing cold sores is herpes simplex virus 1.  It can be spread by skin contact, sharing eating utensils, or even sharing towels.  Cold sores are contagious to other people until dry. (Approximately 5-7 days).  Wash your hands. You can spread the virus to your eyes through handling your contact lenses after touching the lesions.  Most people experience pain at the sight or tingling sensations in their lips that may begin before the ulcers erupt.  Herpes simplex is treatable but not curable.  It may lie dormant for a long time and then reappear due to stress or prolonged sun exposure.  Many patients have success in treating their cold sores with an over the counter topical called Abreva.  You may apply the cream up to 5 times daily (maximum 10 days) until healing occurs.  If you would like to use an oral antiviral medication to speed the healing of your cold sore, I have sent a prescription to your local pharmacy Valacyclovir 2 gm twice daily for 1 day    HOME CARE:   Wash your hands frequently.  Do not pick at or rub the sore.  Don't open the blisters.  Avoid kissing other people during this time.  Avoid sharing drinking glasses, eating utensils, or razors.  Do not handle contact lenses unless you have thoroughly washed your hands with soap and warm water!  Avoid oral sex during this time.   Herpes from sores on your mouth can spread to your partner's genital area.  Avoid contact with anyone who has eczema or a weakened immune system.  Cold sores are often triggered by exposure to intense sunlight, use a lip balm containing a sunscreen (SPF 30 or higher).  GET HELP RIGHT AWAY IF:   Blisters look infected.  Blisters occur near or in the eye.  Symptoms last longer than 10 days.  Your symptoms become worse.  MAKE SURE YOU:   Understand these instructions.  Will watch your condition.  Will get help right away if you are not doing well or get worse.    Your e-visit answers were reviewed by a board certified advanced clinical practitioner to complete your personal care plan.  Depending upon the condition, your plan could have  Included both over the counter or prescription medications.    Please review your pharmacy choice.  Be sure that the pharmacy you have chosen is open so that you can pick up your prescription now.  If there is a problem you can message your provider in [place] to have the prescription routed to another pharmacy.    Your safety is important to [place].  If you have drug allergies check our prescription carefully.  For the next 24 hours you can use MyChart to ask questions about today's visit, request a non-urgent  call back, or ask for a work or school excuse from your e-visit provider.  You will get an email in the next two days asking about your experience.  I hope that your e-visit has been valuable and will speed your recovery.   I spent 7 min in review of this chart- Lacy Duverney [facility]

## 2019-02-27 NOTE — Telephone Encounter (Signed)
Returned call to pt., to make aware that Dr. Nolon Rod is not in office today.  Recommended that she go ahead and pick-up/ start the ValAcyclovir that was order today through her E-Visit, and await further recommendations from Dr. Nolon Rod.  Pt. verb. Understanding.  Agreed with plan.

## 2019-02-27 NOTE — Telephone Encounter (Signed)
Message from Gustavus Messing sent at 02/27/2019 1:23 PM EDT   Summary: Questions   Patient would like to speak to a nurse about current outbreak and getting a higher dose of medicine. Patient wants more than 4 tablets because she wants to take them as needed for the cold sores and wants to speak with a nurse about what she could be doing to cause more outbreaks

## 2019-02-28 ENCOUNTER — Other Ambulatory Visit: Payer: Self-pay

## 2019-02-28 DIAGNOSIS — B002 Herpesviral gingivostomatitis and pharyngotonsillitis: Secondary | ICD-10-CM

## 2019-02-28 MED ORDER — VALACYCLOVIR HCL 1 G PO TABS: 1000.0000 mg | ORAL_TABLET | Freq: Two times a day (BID) | ORAL | 6 refills | Status: DC

## 2019-02-28 MED FILL — valACYclovir HCL 1 GM TABS: 1 | 15 days supply | Qty: 30 | Fill #0

## 2019-02-28 NOTE — Telephone Encounter (Signed)
Spoke with pt today and informed her that new Rx has been sent to pharmacy, she verbalized understanding.

## 2019-03-28 ENCOUNTER — Encounter: Payer: No Typology Code available for payment source | Admitting: Family Medicine

## 2019-04-18 MED FILL — valACYclovir HCL 1 GM TABS: 1 | 15 days supply | Qty: 30 | Fill #1

## 2019-04-28 ENCOUNTER — Other Ambulatory Visit: Payer: Self-pay

## 2019-04-28 DIAGNOSIS — Z0000 Encounter for general adult medical examination without abnormal findings: Secondary | ICD-10-CM

## 2019-04-28 DIAGNOSIS — Z124 Encounter for screening for malignant neoplasm of cervix: Secondary | ICD-10-CM

## 2019-04-28 DIAGNOSIS — Z131 Encounter for screening for diabetes mellitus: Secondary | ICD-10-CM

## 2019-05-08 ENCOUNTER — Ambulatory Visit (INDEPENDENT_AMBULATORY_CARE_PROVIDER_SITE_OTHER): Payer: No Typology Code available for payment source | Admitting: Family Medicine

## 2019-05-08 ENCOUNTER — Other Ambulatory Visit: Payer: Self-pay

## 2019-05-08 ENCOUNTER — Encounter: Payer: Self-pay | Admitting: Family Medicine

## 2019-05-08 VITALS — BP 118/62 | HR 94 | Temp 98.8°F | Resp 17 | Ht 60.0 in | Wt 167.0 lb

## 2019-05-08 DIAGNOSIS — H04123 Dry eye syndrome of bilateral lacrimal glands: Secondary | ICD-10-CM

## 2019-05-08 DIAGNOSIS — Z0000 Encounter for general adult medical examination without abnormal findings: Secondary | ICD-10-CM

## 2019-05-08 DIAGNOSIS — Z130 Encounter for screening for diseases of the blood and blood-forming organs and certain disorders involving the immune mechanism: Secondary | ICD-10-CM

## 2019-05-08 DIAGNOSIS — Z0001 Encounter for general adult medical examination with abnormal findings: Secondary | ICD-10-CM | POA: Diagnosis not present

## 2019-05-08 DIAGNOSIS — Z13220 Encounter for screening for lipoid disorders: Secondary | ICD-10-CM

## 2019-05-08 DIAGNOSIS — Z289 Immunization not carried out for unspecified reason: Secondary | ICD-10-CM

## 2019-05-08 DIAGNOSIS — Z136 Encounter for screening for cardiovascular disorders: Secondary | ICD-10-CM

## 2019-05-08 DIAGNOSIS — J301 Allergic rhinitis due to pollen: Secondary | ICD-10-CM

## 2019-05-08 MED ORDER — OLOPATADINE HCL 0.1 % OP SOLN: 1.0000 [drp] | Freq: Two times a day (BID) | OPHTHALMIC | 12 refills | Status: DC

## 2019-05-08 MED FILL — OLOPATADINE HCL 0.1 % SOLN: 0.1 | 25 days supply | Qty: 5 | Fill #0

## 2019-05-08 NOTE — Patient Instructions (Addendum)
If you have lab work done today you will be contacted with your lab results within the next 2 weeks.  If you have not heard from [place] then please contact us. The fastest way to get your results is to register for My Chart.   IF you received an x-ray today, you will receive an invoice from [facility]. Please contact [facility] at [phone] with questions or concerns regarding your invoice.   IF you received labwork today, you will receive an invoice from [facility]. Please contact [facility] at [phone] with questions or concerns regarding your invoice.   Our billing staff will not be able to assist you with questions regarding bills from these companies.  You will be contacted with the lab results as soon as they are available. The fastest way to get your results is to activate your My Chart account. Instructions are located on the last page of this paperwork. If you have not heard from [place] regarding the results in 2 weeks, please contact this office.     Preparing for Pregnancy If you are considering becoming pregnant, make an appointment to see your regular health care provider to learn how to prepare for a safe and healthy pregnancy (preconception care). During a preconception care visit, your health care provider will:  Do a complete physical exam, including a Pap test.  Take a complete medical history.  Give you information, answer your questions, and help you resolve problems. Preconception checklist Medical history  Tell your health care provider about any current or past medical conditions. Your pregnancy or your ability to become pregnant may be affected by chronic conditions, such as diabetes, chronic hypertension, and thyroid problems.  Include your family's medical history as well as your partner's medical history.  Tell your health care provider about any history of STIs (sexually transmitted infections).These can affect your pregnancy. In some  cases, they can be passed to your baby. Discuss any concerns that you have about STIs.  If indicated, discuss the benefits of genetic testing. This testing will show whether there are any genetic conditions that may be passed from you or your partner to your baby.  Tell your health care provider about: ? Any problems you have had with conception or pregnancy. ? Any medicines you take. These include vitamins, herbal supplements, and over-the-counter medicines. ? Your history of immunizations. Discuss any vaccinations that you may need. Diet  Ask your health care provider what to include in a healthy diet that has a balance of nutrients. This is especially important when you are pregnant or preparing to become pregnant.  Ask your health care provider to help you reach a healthy weight before pregnancy. ? If you are overweight, you may be at higher risk for certain complications, such as high blood pressure, diabetes, and preterm birth. ? If you are underweight, you are more likely to have a baby who has a low birth weight. Lifestyle, work, and home  Let your health care provider know: ? About any lifestyle habits that you have, such as alcohol use, drug use, or smoking. ? About recreational activities that may put you at risk during pregnancy, such as downhill skiing and certain exercise programs. ? Tell your health care provider about any international travel, especially any travel to places with an active [place] virus outbreak. ? About harmful substances that you may be exposed to at work or at home. These include chemicals, pesticides, radiation, or even litter boxes. ? If you do  not feel safe at home. Mental health  Tell your health care provider about: ? Any history of mental health conditions, including feelings of depression, sadness, or anxiety. ? Any medicines that you take for a mental health condition. These include herbs and supplements. Home instructions to prepare for  pregnancy Lifestyle   Eat a balanced diet. This includes fresh fruits and vegetables, whole grains, lean meats, low-fat dairy products, healthy fats, and foods that are high in fiber. Ask to meet with a nutritionist or registered dietitian for assistance with meal planning and goals.  Get regular exercise. Try to be active for at least 30 minutes a day on most days of the week. Ask your health care provider which activities are safe during pregnancy.  Do not use any products that contain nicotine or tobacco, such as cigarettes and e-cigarettes. If you need help quitting, ask your health care provider.  Do not drink alcohol.  Do not take illegal drugs.  Maintain a healthy weight. Ask your health care provider what weight range is right for you. General instructions  Keep an accurate record of your menstrual periods. This makes it easier for your health care provider to determine your baby's due date.  Begin taking prenatal vitamins and folic acid supplements daily as directed by your health care provider.  Manage any chronic conditions, such as high blood pressure and diabetes, as told by your health care provider. This is important. How do I know that I am pregnant? You may be pregnant if you have been sexually active and you miss your period. Symptoms of early pregnancy include:  Mild cramping.  Very light vaginal bleeding (spotting).  Feeling unusually tired.  Nausea and vomiting (morning sickness). If you have any of these symptoms and you suspect that you might be pregnant, you can take a home pregnancy test. These tests check for a hormone in your urine (human chorionic gonadotropin, or hCG). A woman's body begins to make this hormone during early pregnancy. These tests are very accurate. Wait until at least the first day after you miss your period to take one. If the test shows that you are pregnant (you get a positive result), call your health care provider to make an  appointment for prenatal care. What should I do if I become pregnant?      Make an appointment with your health care provider as soon as you suspect you are pregnant.  Do not use any products that contain nicotine, such as cigarettes, chewing tobacco, and e-cigarettes. If you need help quitting, ask your health care provider.  Do not drink alcoholic beverages. Alcohol is related to a number of birth defects.  Avoid toxic odors and chemicals.  You may continue to have sexual intercourse if it does not cause pain or other problems, such as vaginal bleeding. This information is not intended to replace advice given to you by your health care provider. Make sure you discuss any questions you have with your health care provider. Document Released: 11/19/2008 Document Revised: 12/09/2017 Document Reviewed: 06/28/2016 Elsevier Interactive Patient Education  2019 [employer].

## 2019-05-08 NOTE — Progress Notes (Signed)
Chief Complaint  Patient presents with  . Annual Exam    pap, no concerns per pt  . Medication Refill    albuterol inhaler    Subjective:  Kimberly Cooper Pulse is a 28 y.o. female here for a health maintenance visit.  Patient is established pt  Patient Active Problem List   Diagnosis Date Noted  . Pelvic pain in female 07/17/2016  . Dyspareunia, female 07/17/2016  . Loose stools 07/17/2016  . Dizziness and giddiness 07/17/2016  . Dysmenorrhea 08/23/2012    Past Medical History:  Diagnosis Date  . Allergy   . Chlamydia   . GERD (gastroesophageal reflux disease)   . Gonorrhea   . MRSA infection greater than 3 months ago 2003   ABDOMINAL CYST    No past surgical history on file.   Outpatient Medications Prior to Visit  Medication Sig Dispense Refill  . valACYclovir (VALTREX) 1000 MG tablet Take 1 tablet (1,000 mg total) by mouth 2 (two) times daily. 30 tablet 6  . albuterol (PROVENTIL HFA;VENTOLIN HFA) 108 (90 Base) MCG/ACT inhaler Inhale 2 puffs into the lungs every 6 (six) hours as needed for wheezing or shortness of breath. 1 Inhaler 0  . oseltamivir (TAMIFLU) 75 MG capsule Take 1 capsule (75 mg total) by mouth 2 (two) times daily. (Patient not taking: Reported on 05/08/2019) 10 capsule 0   No facility-administered medications prior to visit.     Allergies  Allergen Reactions  . Other Hives    All Raw Nuts: Swelling  . Penicillins Hives     Family History  Problem Relation Age of Onset  . Hypertension Mother   . Heart disease Father 30       MI  . Other Father        gunshot, paralysis  . Cancer Maternal Grandfather        lung/prostate  . Stroke Maternal Grandmother   . Cancer Paternal Aunt 6       stomach cancer     Health Habits: Dental Exam: up to date Eye Exam: up to date Exercise: 4 times/week on average Current exercise activities: walking/running Diet: balanced  Social History   Socioeconomic History  . Marital status: Single    Spouse  name: Not on file  . Number of children: Not on file  . Years of education: Not on file  . Highest education level: Not on file  Occupational History  . Not on file  Social Needs  . Financial resource strain: Not on file  . Food insecurity:    Worry: Not on file    Inability: Not on file  . Transportation needs:    Medical: Not on file    Non-medical: Not on file  Tobacco Use  . Smoking status: Never Smoker  . Smokeless tobacco: Never Used  Substance and Sexual Activity  . Alcohol use: Yes    Alcohol/week: 2.0 standard drinks    Types: 1 Glasses of wine, 1 Cans of beer per week    Comment: social  . Drug use: No  . Sexual activity: Yes    Birth control/protection: None  Lifestyle  . Physical activity:    Days per week: Not on file    Minutes per session: Not on file  . Stress: Not on file  Relationships  . Social connections:    Talks on phone: Not on file    Gets together: Not on file    Attends religious service: Not on file    Active  member of club or organization: Not on file    Attends meetings of clubs or organizations: Not on file    Relationship status: Not on file  . Intimate partner violence:    Fear of current or ex partner: Not on file    Emotionally abused: Not on file    Physically abused: Not on file    Forced sexual activity: Not on file  Other Topics Concern  . Not on file  Social History Narrative  . Not on file   Social History   Substance and Sexual Activity  Alcohol Use Yes  . Alcohol/week: 2.0 standard drinks  . Types: 1 Glasses of wine, 1 Cans of beer per week   Comment: social   Social History   Tobacco Use  Smoking Status Never Smoker  Smokeless Tobacco Never Used   Social History   Substance and Sexual Activity  Drug Use No    GYN: Sexual Health Menstrual status: regular menses LMP: Patient's last menstrual period was 04/13/2019. Last pap smear: see HM section History of abnormal pap smears: none. Pap smear up to  date Sexually active: with female partner Current contraception: none  Health Maintenance: See under health Maintenance activity for review of completion dates as well.  There is no immunization history on file for this patient.    Depression Screen-PHQ2/9 Depression screen [facility] 2/9 05/08/2019 11/25/2018 10/24/2018 03/21/2018 01/05/2018  Decreased Interest 0 0 0 0 0  Down, Depressed, Hopeless 0 0 0 0 0  PHQ - 2 Score 0 0 0 0 0       Depression Severity and Treatment Recommendations:  0-4= None  5-9= Mild / Treatment: Support, educate to call if worse; return in one month  10-14= Moderate / Treatment: Support, watchful waiting; Antidepressant or Psycotherapy  15-19= Moderately severe / Treatment: Antidepressant OR Psychotherapy  >= 20 = Major depression, severe / Antidepressant AND Psychotherapy    Review of Systems   ROS  See HPI for ROS as well.   Review of Systems  Constitutional: Negative for activity change, appetite change, chills and fever.  HENT: Negative for congestion, nosebleeds, trouble swallowing and voice change.  +bilateral dry eyes Respiratory: Negative for cough, shortness of breath and wheezing.   Gastrointestinal: Negative for diarrhea, nausea and vomiting.  Genitourinary: Negative for difficulty urinating, dysuria, flank pain and hematuria.  Musculoskeletal: Negative for back pain, joint swelling and neck pain.  Neurological: Negative for dizziness, speech difficulty, light-headedness and numbness.  See HPI. All other review of systems negative.    Objective:   Vitals:   05/08/19 0848  BP: 118/62  Pulse: 94  Resp: 17  Temp: 98.8 F (37.1 C)  TempSrc: Oral  SpO2: 98%  Weight: 167 lb (75.8 kg)  Height: 5' (1.524 m)   Wt Readings from Last 3 Encounters:  05/08/19 167 lb (75.8 kg)  11/25/18 155 lb 3.2 oz (70.4 kg)  10/24/18 153 lb (69.4 kg)    Body mass index is 32.61 kg/m.  Physical Exam  BP 118/62 (BP Location: Left Arm, Patient Position:  Sitting, Cuff Size: Normal)   Pulse 94   Temp 98.8 F (37.1 C) (Oral)   Resp 17   Ht 5' (1.524 m)   Wt 167 lb (75.8 kg)   LMP 04/13/2019   SpO2 98%   BMI 32.61 kg/m   General Appearance:    Alert, cooperative, no distress, appears stated age  Head:    Normocephalic, without obvious abnormality, atraumatic  Eyes:  PERRL, conjunctiva/corneas clear, EOM's intact  Ears:    Normal TM's and external ear canals, both ears  Nose:   Nares normal, septum midline, mucosa normal, no drainage    or sinus tenderness  Throat:   Lips, mucosa, and tongue normal; teeth and gums normal  Neck:   Supple, symmetrical, trachea midline, no adenopathy;    thyroid:  no enlargement/tenderness/nodules  Back:     Symmetric, no curvature, ROM normal, no CVA tenderness  Lungs:     Clear to auscultation bilaterally, respirations unlabored  Chest Wall:    No tenderness or deformity   Heart:    Regular rate and rhythm, S1 and S2 normal, no murmur, rub   or gallop  Breast Exam:    No tenderness, masses, or nipple abnormality  Abdomen:     Soft, non-tender, bowel sounds active all four quadrants,    no masses, no organomegaly  Genitalia:  Pap smear up to date, deferred     Extremities:   Extremities normal, atraumatic, no cyanosis or edema  Pulses:   2+ and symmetric all extremities  Skin:   Skin color, texture, turgor normal, no rashes or lesions  Lymph nodes:   Cervical, supraclavicular, and axillary nodes normal  Neurologic:   CNII-XII intact, normal strength, sensation and reflexes    throughout      Assessment/Plan:   Patient was seen for a health maintenance exam.  Counseled the patient on health maintenance issues. Reviewed her health mainteance schedule and ordered appropriate tests (see orders.) Counseled on regular exercise and weight management. Recommend regular eye exams and dental cleaning.   The following issues were addressed today for health maintenance:   Caren was seen today for  annual exam and medication refill.  Diagnoses and all orders for this visit:  Healthcare maintenance- Women's Health Maintenance Plan Advised monthly breast exam and annual mammogram Advised dental exam every six months Discussed stress management Discussed pap smear screening guidelines    Encounter for lipid screening for cardiovascular disease -     Lipid panel  Screening for iron deficiency anemia -     CBC  Bilateral dry eyes- normal eye exam, likely allergies -     CBC -     CMP14+EGFR -     olopatadine (PATADAY) 0.1 % ophthalmic solution; Place 1 drop into both eyes 2 (two) times daily.  Seasonal allergic rhinitis due to pollen -     olopatadine (PATADAY) 0.1 % ophthalmic solution; Place 1 drop into both eyes 2 (two) times daily.    No follow-ups on file.    Body mass index is 32.61 kg/m.:  Discussed the patient's BMI with patient. The BMI body mass index is 32.61 kg/m.     No future appointments.  Patient Instructions       If you have lab work done today you will be contacted with your lab results within the next 2 weeks.  If you have not heard from [place] then please contact us. The fastest way to get your results is to register for My Chart.   IF you received an x-ray today, you will receive an invoice from [facility]. Please contact [facility] at [phone] with questions or concerns regarding your invoice.   IF you received labwork today, you will receive an invoice from [facility]. Please contact [facility] at [phone] with questions or concerns regarding your invoice.   Our billing staff will not be able to assist you with questions regarding bills from these  companies.  You will be contacted with the lab results as soon as they are available. The fastest way to get your results is to activate your My Chart account. Instructions are located on the last page of this paperwork. If you have not heard from [place] regarding the results in  2 weeks, please contact this office.

## 2019-05-09 LAB — CBC
Hematocrit: 39 % (ref 34.0–46.6)
Hemoglobin: 13.4 g/dL (ref 11.1–15.9)
MCH: 30.9 pg (ref 26.6–33.0)
MCHC: 34.4 g/dL (ref 31.5–35.7)
MCV: 90 fL (ref 79–97)
Platelets: 212 10*3/uL (ref 150–450)
RBC: 4.34 x10E6/uL (ref 3.77–5.28)
RDW: 12 % (ref 11.7–15.4)
WBC: 7.5 10*3/uL (ref 3.4–10.8)

## 2019-05-09 LAB — CMP14+EGFR
ALT: 9 IU/L (ref 0–32)
AST: 12 IU/L (ref 0–40)
Albumin/Globulin Ratio: 1.4 (ref 1.2–2.2)
Albumin: 4.2 g/dL (ref 3.9–5.0)
Alkaline Phosphatase: 63 IU/L (ref 39–117)
BUN/Creatinine Ratio: 13 (ref 9–23)
BUN: 7 mg/dL (ref 6–20)
Bilirubin Total: 0.3 mg/dL (ref 0.0–1.2)
CO2: 21 mmol/L (ref 20–29)
Calcium: 9.3 mg/dL (ref 8.7–10.2)
Chloride: 103 mmol/L (ref 96–106)
Creatinine, Ser: 0.53 mg/dL — ABNORMAL LOW (ref 0.57–1.00)
GFR calc Af Amer: 149 mL/min/{1.73_m2} (ref >59)
GFR calc non Af Amer: 130 mL/min/{1.73_m2} (ref >59)
Globulin, Total: 2.9 g/dL (ref 1.5–4.5)
Glucose: 72 mg/dL (ref 65–99)
Potassium: 3.9 mmol/L (ref 3.5–5.2)
Sodium: 137 mmol/L (ref 134–144)
Total Protein: 7.1 g/dL (ref 6.0–8.5)

## 2019-05-09 LAB — LIPID PANEL
Chol/HDL Ratio: 2.7 ratio (ref 0.0–4.4)
Cholesterol, Total: 152 mg/dL (ref 100–199)
HDL: 56 mg/dL (ref >39)
LDL Calculated: 83 mg/dL (ref 0–99)
Triglycerides: 66 mg/dL (ref 0–149)
VLDL Cholesterol Cal: 13 mg/dL (ref 5–40)

## 2019-05-09 LAB — VARICELLA ZOSTER ABS, IGG/IGM
Varicella IgM: <0.91 index (ref 0.00–0.90)
Varicella zoster IgG: 193 index (ref >165)

## 2019-06-03 ENCOUNTER — Telehealth: Payer: No Typology Code available for payment source | Admitting: Physician Assistant

## 2019-06-03 DIAGNOSIS — B001 Herpesviral vesicular dermatitis: Secondary | ICD-10-CM | POA: Diagnosis not present

## 2019-06-03 MED ORDER — VALACYCLOVIR HCL 1 G PO TABS: 2000.0000 mg | ORAL_TABLET | Freq: Two times a day (BID) | ORAL | 0 refills | Status: DC

## 2019-06-03 NOTE — Addendum Note (Signed)
Addended by: Brunetta Jeans on: 06/03/2019 08:10 PM   Modules accepted: Orders

## 2019-06-03 NOTE — Progress Notes (Signed)
We are sorry that you are not feeling well.  Here is how we plan to help!  Based on what you have shared with me it does look like you have a viral infection.    Most cold sores or fever blisters are small fluid filled blisters around the mouth caused by herpes simplex virus.  The most common strain of the virus causing cold sores is herpes simplex virus 1.  It can be spread by skin contact, sharing eating utensils, or even sharing towels.  Cold sores are contagious to other people until dry. (Approximately 5-7 days).  Wash your hands. You can spread the virus to your eyes through handling your contact lenses after touching the lesions.  Most people experience pain at the sight or tingling sensations in their lips that may begin before the ulcers erupt.  Herpes simplex is treatable but not curable.  It may lie dormant for a long time and then reappear due to stress or prolonged sun exposure.  Many patients have success in treating their cold sores with an over the counter topical called Abreva.  You may apply the cream up to 5 times daily (maximum 10 days) until healing occurs.  I cannot sent a prescription outside [place]. I see you have a chronic prescription for Valtrex that you are supposed to take daily. If you have those with you in [facility], I recommend that you take 2 tablets and then 2 more tablets 12 hours later as this is the usually dose for an outbreak. Then resume regular dosing. If you do not have any of your Valtrex (Valacyclovir) with you there, I would recommend reaching out to your primary care or to be seen at a local Urgent Care  HOME CARE:   Wash your hands frequently.  Do not pick at or rub the sore.  Don't open the blisters.  Avoid kissing other people during this time.  Avoid sharing drinking glasses, eating utensils, or razors.  Do not handle contact lenses unless you have thoroughly washed your hands with soap and warm water!  Avoid oral sex during this time.  Herpes from  sores on your mouth can spread to your partner's genital area.  Avoid contact with anyone who has eczema or a weakened immune system.  Cold sores are often triggered by exposure to intense sunlight, use a lip balm containing a sunscreen (SPF 30 or higher).  GET HELP RIGHT AWAY IF:   Blisters look infected.  Blisters occur near or in the eye.  Symptoms last longer than 10 days.  Your symptoms become worse.  MAKE SURE YOU:   Understand these instructions.  Will watch your condition.  Will get help right away if you are not doing well or get worse.    Your e-visit answers were reviewed by a board certified advanced clinical practitioner to complete your personal care plan.  Depending upon the condition, your plan could have  Included both over the counter or prescription medications.    Please review your pharmacy choice.  Be sure that the pharmacy you have chosen is open so that you can pick up your prescription now.  If there is a problem you can message your provider in [place] to have the prescription routed to another pharmacy.    Your safety is important to [place].  If you have drug allergies check our prescription carefully.  For the next 24 hours you can use MyChart to ask questions about today's visit, request a non-urgent call back, or  ask for a work or school excuse from your e-visit provider.  You will get an email in the next two days asking about your experience.  I hope that your e-visit has been valuable and will speed your recovery.

## 2019-06-03 NOTE — Progress Notes (Signed)
I have spent 5 minutes in review of e-visit questionnaire, review and updating patient chart, medical decision making and response to patient.   Osei Anger Cody Mikell Kazlauskas, PA-C    

## 2019-06-07 MED FILL — valACYclovir HCL 1 GM TABS: 1 | 15 days supply | Qty: 30 | Fill #2

## 2019-07-14 MED FILL — valACYclovir HCL 1 GM TABS: 1 | 15 days supply | Qty: 30 | Fill #3

## 2019-07-21 MED FILL — OLOPATADINE HCL 0.1% EYE DR: 0.1 | 25 days supply | Qty: 5 | Fill #0

## 2019-09-07 MED FILL — valACYclovir HCL 1 GM TABS: 1 | 15 days supply | Qty: 30 | Fill #4

## 2019-09-07 MED FILL — OLOPATADINE HCL 0.1 % SOLN: 0.1 | 25 days supply | Qty: 5 | Fill #0

## 2019-10-13 MED FILL — valACYclovir HCL 1 GM TABS: 1 | 15 days supply | Qty: 30 | Fill #5

## 2019-10-27 ENCOUNTER — Telehealth: Payer: Self-pay | Admitting: Family Medicine

## 2019-10-27 NOTE — Telephone Encounter (Signed)
Spoke with pt after speaking with pcp about rx request and informed her that she would have to contact her dermatologist to have this medication refilled, she verbalized understanding.

## 2019-10-27 NOTE — Telephone Encounter (Signed)
Pt wants to know if PCP Retin -A Tretinoin gel or does she have to get it from the dermatologist / please advise

## 2019-11-27 MED FILL — valACYclovir HCL 1 GM TABS: 1 | 15 days supply | Qty: 30 | Fill #6

## 2020-01-05 ENCOUNTER — Ambulatory Visit (INDEPENDENT_AMBULATORY_CARE_PROVIDER_SITE_OTHER): Payer: No Typology Code available for payment source | Admitting: Adult Health Nurse Practitioner

## 2020-01-05 ENCOUNTER — Other Ambulatory Visit (HOSPITAL_COMMUNITY)
Admission: RE | Admit: 2020-01-05 | Discharge: 2020-01-05 | Disposition: A | Discharge status: Home / Self Care | Payer: No Typology Code available for payment source | Source: Ambulatory Visit | Attending: Adult Health Nurse Practitioner | Admitting: Adult Health Nurse Practitioner

## 2020-01-05 ENCOUNTER — Other Ambulatory Visit: Payer: Self-pay

## 2020-01-05 VITALS — BP 100/64 | HR 73 | Temp 97.9°F | Ht 60.0 in | Wt 166.6 lb

## 2020-01-05 DIAGNOSIS — R319 Hematuria, unspecified: Secondary | ICD-10-CM | POA: Insufficient documentation

## 2020-01-05 DIAGNOSIS — Z3200 Encounter for pregnancy test, result unknown: Secondary | ICD-10-CM

## 2020-01-05 DIAGNOSIS — N760 Acute vaginitis: Secondary | ICD-10-CM | POA: Diagnosis not present

## 2020-01-05 DIAGNOSIS — N930 Postcoital and contact bleeding: Secondary | ICD-10-CM | POA: Insufficient documentation

## 2020-01-05 DIAGNOSIS — B9689 Other specified bacterial agents as the cause of diseases classified elsewhere: Secondary | ICD-10-CM | POA: Insufficient documentation

## 2020-01-05 LAB — POCT WET + KOH PREP
Trich by wet prep: ABSENT
Yeast by KOH: ABSENT
Yeast by wet prep: ABSENT

## 2020-01-05 LAB — POCT URINALYSIS DIP (MANUAL ENTRY)
Bilirubin, UA: NEGATIVE
Blood, UA: NEGATIVE
Glucose, UA: NEGATIVE mg/dL
Ketones, POC UA: NEGATIVE mg/dL
Nitrite, UA: NEGATIVE
Protein Ur, POC: NEGATIVE mg/dL
Spec Grav, UA: 1.025 (ref 1.010–1.025)
Urobilinogen, UA: 0.2 E.U./dL
pH, UA: 6.5 (ref 5.0–8.0)

## 2020-01-05 LAB — POCT URINE PREGNANCY: Preg Test, Ur: NEGATIVE

## 2020-01-05 MED ORDER — METRONIDAZOLE 500 MG PO TABS: 500.0000 mg | ORAL_TABLET | Freq: Two times a day (BID) | ORAL | 0 refills | Status: AC

## 2020-01-05 MED FILL — METRONIDAZOLE 500 MG TABS: 500 | 14 days supply | Qty: 28 | Fill #0

## 2020-01-05 NOTE — Patient Instructions (Signed)
° ° ° °  If you have lab work done today you will be contacted with your lab results within the next 2 weeks.  If you have not heard from us then please contact us. The fastest way to get your results is to register for My Chart. ° ° °IF you received an x-ray today, you will receive an invoice from [facility]. Please contact [facility] at [phone] with questions or concerns regarding your invoice.  ° °IF you received labwork today, you will receive an invoice from [facility]. Please contact [facility] at [phone] with questions or concerns regarding your invoice.  ° °Our billing staff will not be able to assist you with questions regarding bills from these companies. ° °You will be contacted with the lab results as soon as they are available. The fastest way to get your results is to activate your My Chart account. Instructions are located on the last page of this paperwork. If you have not heard from us regarding the results in 2 weeks, please contact this office. °  ° ° ° °

## 2020-01-05 NOTE — Progress Notes (Signed)
SUBJECTIVE:  29 y.o. female complains spotting since her last menses 12/13/19.  Started having bleeding after sex approximately 1 week ago.  It is significant.  No significant pelvic pain or fever. No UTI symptoms. Denies history of known exposure to STD.  Patient's last menstrual period was 12/13/2019.  OBJECTIVE:  She appears well, afebrile. Abdomen: benign, soft, nontender, no masses. Pelvic Exam: examination not indicated. Urine dipstick: positive for leukocytes, trace Wet Prep:  + for clue cells   ASSESSMENT:  bacterial vaginosis  PLAN:  GC and chlamydia DNA  probe sent to lab. Treatment: Flagyl 500 BID x 7 days and abstain from coitus during course of treatment ROV prn if symptoms persist or worsen. Education about BV and medication was given to the patient.  Verbalized understanding.

## 2020-01-09 ENCOUNTER — Telehealth: Payer: No Typology Code available for payment source | Admitting: Adult Health Nurse Practitioner

## 2020-01-09 ENCOUNTER — Other Ambulatory Visit: Payer: Self-pay

## 2020-01-09 NOTE — Patient Instructions (Signed)
° ° ° °  If you have lab work done today you will be contacted with your lab results within the next 2 weeks.  If you have not heard from us then please contact us. The fastest way to get your results is to register for My Chart. ° ° °IF you received an x-ray today, you will receive an invoice from [facility]. Please contact [facility] at [phone] with questions or concerns regarding your invoice.  ° °IF you received labwork today, you will receive an invoice from [facility]. Please contact [facility] at [phone] with questions or concerns regarding your invoice.  ° °Our billing staff will not be able to assist you with questions regarding bills from these companies. ° °You will be contacted with the lab results as soon as they are available. The fastest way to get your results is to activate your My Chart account. Instructions are located on the last page of this paperwork. If you have not heard from us regarding the results in 2 weeks, please contact this office. °  ° ° ° °

## 2020-01-10 LAB — GC/CHLAMYDIA PROBE AMP (~~LOC~~) NOT AT ARMC
Chlamydia: NEGATIVE
Comment: NEGATIVE
Comment: NORMAL
Neisseria Gonorrhea: NEGATIVE

## 2020-01-12 ENCOUNTER — Encounter: Payer: Self-pay | Admitting: Family Medicine

## 2020-01-25 ENCOUNTER — Telehealth: Payer: Self-pay | Admitting: Family Medicine

## 2020-01-25 NOTE — Telephone Encounter (Signed)
Pt. needs cream for Eczema would like to speak to clinical to see if she could get it or if she would need an appointment. Best number [phone] Please advise

## 2020-01-25 NOTE — Telephone Encounter (Signed)
Pt is requesting a Rx for her Eczema but I don't see where you have prescribed this. Please Advise.

## 2020-02-21 ENCOUNTER — Other Ambulatory Visit: Payer: Self-pay | Admitting: Family Medicine

## 2020-02-21 DIAGNOSIS — B002 Herpesviral gingivostomatitis and pharyngotonsillitis: Secondary | ICD-10-CM

## 2020-02-21 MED FILL — OLOPATADINE HCL 0.1 % SOLN: 0.1 | 25 days supply | Qty: 5 | Fill #1

## 2020-02-21 MED FILL — valACYclovir HCL 1 GM TABS: 1 | 15 days supply | Qty: 30 | Fill #0

## 2020-03-18 MED FILL — HYDROQUINONE 4% CREAM: 4 | 14 days supply | Qty: 28 | Fill #0

## 2020-03-26 ENCOUNTER — Telehealth: Payer: Self-pay

## 2020-03-26 NOTE — Telephone Encounter (Signed)
no action needed

## 2020-04-02 ENCOUNTER — Telehealth: Payer: No Typology Code available for payment source | Admitting: Emergency Medicine

## 2020-04-02 DIAGNOSIS — R110 Nausea: Secondary | ICD-10-CM

## 2020-04-02 MED ORDER — ONDANSETRON 4 MG PO TBDP: 4.0000 mg | ORAL_TABLET | Freq: Three times a day (TID) | ORAL | 0 refills | Status: DC | PRN

## 2020-04-02 MED FILL — ONDANSETRON ODT 4 MG TABLET: 4 | 3 days supply | Qty: 10 | Fill #0

## 2020-04-02 NOTE — Progress Notes (Signed)
We are sorry that you are not feeling well. Here is how we plan to help!  Based on what you have shared with me it looks like you have a Virus that is irritating your GI tract.  Vomiting is the forceful emptying of a portion of the stomach's content through the mouth.  Although nausea and vomiting can make you feel miserable, it's important to remember that these are not diseases, but rather symptoms of an underlying illness.  When we treat short term symptoms, we always caution that any symptoms that persist should be fully evaluated in a medical office.  I have prescribed a medication that will help alleviate your symptoms and allow you to stay hydrated:  Zofran 4 mg 1 tablet every 8 hours as needed for nausea and vomiting  HOME CARE:  Drink clear liquids.  This is very important! Dehydration (the lack of fluid) can lead to a serious complication.  Start off with 1 tablespoon every 5 minutes for 8 hours.  You may begin eating bland foods after 8 hours without vomiting.  Start with saltine crackers, white bread, rice, mashed potatoes, applesauce.  After 48 hours on a bland diet, you may resume a normal diet.  Try to go to sleep.  Sleep often empties the stomach and relieves the need to vomit.  GET HELP RIGHT AWAY IF:   Your symptoms do not improve or worsen within 2 days after treatment.  You have a fever for over 3 days.  You cannot keep down fluids after trying the medication.  MAKE SURE YOU:   Understand these instructions.  Will watch your condition.  Will get help right away if you are not doing well or get worse.   Thank you for choosing an e-visit. Your e-visit answers were reviewed by a board certified advanced clinical practitioner to complete your personal care plan. Depending upon the condition, your plan could have included both over the counter or prescription medications. Please review your pharmacy choice. Be sure that the pharmacy you have chosen is open so  that you can pick up your prescription now.  If there is a problem you may message your provider in MyChart to have the prescription routed to another pharmacy. Your safety is important to us. If you have drug allergies check your prescription carefully.  For the next 24 hours, you can use MyChart to ask questions about today's visit, request a non-urgent call back, or ask for a work or school excuse from your e-visit provider. You will get an e-mail in the next two days asking about your experience. I hope that your e-visit has been valuable and will speed your recovery.   Approximately 5 minutes was used in reviewing the patient's chart, questionnaire, prescribing medications, and documentation.  

## 2020-04-26 ENCOUNTER — Telehealth: Payer: No Typology Code available for payment source | Admitting: Family

## 2020-04-26 DIAGNOSIS — R21 Rash and other nonspecific skin eruption: Secondary | ICD-10-CM | POA: Diagnosis not present

## 2020-04-26 MED ORDER — TRIAMCINOLONE ACETONIDE 0.5 % EX OINT: 1.0000 "application " | TOPICAL_OINTMENT | Freq: Two times a day (BID) | CUTANEOUS | 1 refills | Status: DC

## 2020-04-26 MED FILL — TRIAMCINOLONE 0.5% OINTMENT: 0.5 | 15 days supply | Qty: 30 | Fill #0

## 2020-04-26 NOTE — Progress Notes (Signed)
E Visit for Rash  We are sorry that you are not feeling well. Here is how we plan to help!  Based on what you shared with me it looks like you have contact dermatitis.  Contact dermatitis is a skin rash caused by something that touches the skin and causes irritation or inflammation.  Your skin may be red, swollen, dry, cracked, and itch.  The rash should go away in a few days but can last a few weeks.  If you get a rash, it's important to figure out what caused it so the irritant can be avoided in the future.  I have sent in Kenalog 0.5 % cream that you will apply twice a day.   HOME CARE:   Take cool showers and avoid direct sunlight.  Apply cool compress or wet dressings.  Take a bath in an oatmeal bath.  Sprinkle content of one Aveeno packet under running faucet with comfortably warm water.  Bathe for 15-20 minutes, 1-2 times daily.  Pat dry with a towel. Do not rub the rash.  Use hydrocortisone cream.  Take an antihistamine like Benadryl for widespread rashes that itch.  The adult dose of Benadryl is 25-50 mg by mouth 4 times daily.  Caution:  This type of medication may cause sleepiness.  Do not drink alcohol, drive, or operate dangerous machinery while taking antihistamines.  Do not take these medications if you have prostate enlargement.  Read package instructions thoroughly on all medications that you take.  GET HELP RIGHT AWAY IF:   Symptoms don't go away after treatment.  Severe itching that persists.  If you rash spreads or swells.  If you rash begins to smell.  If it blisters and opens or develops a yellow-brown crust.  You develop a fever.  You have a sore throat.  You become short of breath.  MAKE SURE YOU:  Understand these instructions. Will watch your condition. Will get help right away if you are not doing well or get worse.  Thank you for choosing an e-visit. Your e-visit answers were reviewed by a board certified advanced clinical practitioner to  complete your personal care plan. Depending upon the condition, your plan could have included both over the counter or prescription medications. Please review your pharmacy choice. Be sure that the pharmacy you have chosen is open so that you can pick up your prescription now.  If there is a problem you may message your provider in [place] to have the prescription routed to another pharmacy. Your safety is important to [place]. If you have drug allergies check your prescription carefully.  For the next 24 hours, you can use MyChart to ask questions about today's visit, request a non-urgent call back, or ask for a work or school excuse from your e-visit provider. You will get an email in the next two days asking about your experience. I hope that your e-visit has been valuable and will speed your recovery.   Approximately 5 minutes was spent documenting and reviewing patient's chart.

## 2020-05-08 ENCOUNTER — Ambulatory Visit (INDEPENDENT_AMBULATORY_CARE_PROVIDER_SITE_OTHER): Payer: No Typology Code available for payment source | Admitting: Plastic Surgery

## 2020-05-08 ENCOUNTER — Other Ambulatory Visit: Payer: Self-pay

## 2020-05-08 ENCOUNTER — Encounter: Payer: Self-pay | Admitting: Plastic Surgery

## 2020-05-08 VITALS — BP 110/75 | HR 78 | Temp 98.0°F | Ht 60.0 in | Wt 164.0 lb

## 2020-05-08 DIAGNOSIS — L910 Hypertrophic scar: Secondary | ICD-10-CM | POA: Diagnosis not present

## 2020-05-08 NOTE — Progress Notes (Signed)
Referring Provider Forrest Moron, MD [place],  [place] [address]   CC:  Chief Complaint  Patient presents with  . Consult    hypertrophic scar-keloid of the right ear      Kimberly Cooper is an 29 y.o. female.  HPI: Patient presents to discuss the keloid behind her right ear.  This has been excised at least once before.  She is also received multiple steroid injections.  He has been stable stable in size for approximately the past 2 years.  She wants to see what other options she has.  She is bothered by the fact that she cannot wear earrings.  Allergies  Allergen Reactions  . Other Hives    All Raw Nuts: Swelling  . Penicillins Hives    Outpatient Encounter Medications as of 05/08/2020  Medication Sig  . oseltamivir (TAMIFLU) 75 MG capsule Take 1 capsule (75 mg total) by mouth 2 (two) times daily.  Marland Kitchen triamcinolone ointment (KENALOG) 0.5 % Apply 1 application topically 2 (two) times daily.  . valACYclovir (VALTREX) 1000 MG tablet TAKE 1 TABLET BY MOUTH TWO TIMES DAILY  . albuterol (PROVENTIL HFA;VENTOLIN HFA) 108 (90 Base) MCG/ACT inhaler Inhale 2 puffs into the lungs every 6 (six) hours as needed for wheezing or shortness of breath.  Marland Kitchen olopatadine (PATADAY) 0.1 % ophthalmic solution Place 1 drop into both eyes 2 (two) times daily. (Patient not taking: Reported on 05/08/2020)  . ondansetron (ZOFRAN ODT) 4 MG disintegrating tablet Take 1 tablet (4 mg total) by mouth every 8 (eight) hours as needed for nausea or vomiting. (Patient not taking: Reported on 05/08/2020)  . valACYclovir (VALTREX) 1000 MG tablet Take 2 tablets (2,000 mg total) by mouth 2 (two) times daily. (Patient not taking: Reported on 05/08/2020)   No facility-administered encounter medications on file as of 05/08/2020.     Past Medical History:  Diagnosis Date  . Allergy   . Chlamydia   . GERD (gastroesophageal reflux disease)   . Gonorrhea   . MRSA infection greater than 3 months ago 2003   ABDOMINAL CYST    No past surgical history on file.  Family History  Problem Relation Age of Onset  . Hypertension Mother   . Heart disease Father 90       MI  . Other Father        gunshot, paralysis  . Cancer Maternal Grandfather        lung/prostate  . Stroke Maternal Grandmother   . Cancer Paternal Aunt 21       stomach cancer    Social History   Social History Narrative  . Not on file     Review of Systems General: Denies fevers, chills, weight loss CV: Denies chest pain, shortness of breath, palpitations  Physical Exam Vitals with BMI 05/08/2020 01/05/2020 05/08/2019  Height 5\' 0"  5\' 0"  5\' 0"   Weight 164 lbs 166 lbs 10 oz 167 lbs  BMI 32.03 123456 0000000  Systolic A999333 [id number] 123456  Diastolic 75 64 62  Pulse 78 73 94    General:  No acute distress,  Alert and oriented, Non-Toxic, Normal speech and affect Posterior aspect of the right earlobe shows a soft keloid that is about 3 cm in diameter encompassing the majority of the posterior surface of the lobe.  Assessment/Plan Patient presents with a recurrent keloid in the right ear.  We discussed the options that include steroid injection and excision combined with full-thickness skin graft.  It sounds  like she does not feel like the steroid injections have done much previously.  She is not quite ready to proceed with excision and skin grafting which I think is reasonable at this point as its not that visible from conversational distance.  We discussed the options in detail and she is going to think about it.  She can follow-up with [place] as needed.  Cindra Presume 05/08/2020, 11:02 AM

## 2020-05-30 ENCOUNTER — Telehealth: Payer: No Typology Code available for payment source | Admitting: Physician Assistant

## 2020-05-30 DIAGNOSIS — R21 Rash and other nonspecific skin eruption: Secondary | ICD-10-CM | POA: Diagnosis not present

## 2020-05-30 MED ORDER — PREDNISONE 10 MG PO TABS: 10.0000 mg | ORAL_TABLET | Freq: Every day | ORAL | 0 refills | Status: AC

## 2020-05-30 NOTE — Progress Notes (Signed)
E Visit for Rash  We are sorry that you are not feeling well. Here is how we plan to help!  Based on what you shared with me it looks like you have contact dermatitis.  Contact dermatitis is a skin rash caused by something that touches the skin and causes irritation or inflammation.  Your skin may be red, swollen, dry, cracked, and itch.  The rash should go away in a few days but can last a few weeks.  If you get a rash, it's important to figure out what caused it so the irritant can be avoided in the future. and I have prescribed Prednisone 10 mg daily for 5 days  HOME CARE:   Take cool showers and avoid direct sunlight.  Apply cool compress or wet dressings.  Take a bath in an oatmeal bath.  Sprinkle content of one Aveeno packet under running faucet with comfortably warm water.  Bathe for 15-20 minutes, 1-2 times daily.  Pat dry with a towel. Do not rub the rash.  Use hydrocortisone cream.  Take an antihistamine like Benadryl for widespread rashes that itch.  The adult dose of Benadryl is 25-50 mg by mouth 4 times daily.  Caution:  This type of medication may cause sleepiness.  Do not drink alcohol, drive, or operate dangerous machinery while taking antihistamines.  Do not take these medications if you have prostate enlargement.  Read package instructions thoroughly on all medications that you take.  GET HELP RIGHT AWAY IF:   Symptoms don't go away after treatment.  Severe itching that persists.  If you rash spreads or swells.  If you rash begins to smell.  If it blisters and opens or develops a yellow-brown crust.  You develop a fever.  You have a sore throat.  You become short of breath.  MAKE SURE YOU:  Understand these instructions. Will watch your condition. Will get help right away if you are not doing well or get worse.  Thank you for choosing an e-visit. Your e-visit answers were reviewed by a board certified advanced clinical practitioner to complete your  personal care plan. Depending upon the condition, your plan could have included both over the counter or prescription medications. Please review your pharmacy choice. Be sure that the pharmacy you have chosen is open so that you can pick up your prescription now.  If there is a problem you may message your provider in [place] to have the prescription routed to another pharmacy. Your safety is important to [place]. If you have drug allergies check your prescription carefully.  For the next 24 hours, you can use MyChart to ask questions about todays visit, request a non-urgent call back, or ask for a work or school excuse from your e-visit provider. You will get an email in the next two days asking about your experience. I hope that your e-visit has been valuable and will speed your recovery.     Greater than 5 minutes, yet less than 10 minutes of time have been spent researching, coordinating and implementing care for this patient today.

## 2020-06-12 ENCOUNTER — Telehealth: Payer: No Typology Code available for payment source | Admitting: Family

## 2020-06-12 DIAGNOSIS — B373 Candidiasis of vulva and vagina: Secondary | ICD-10-CM | POA: Diagnosis not present

## 2020-06-12 DIAGNOSIS — B3731 Acute candidiasis of vulva and vagina: Secondary | ICD-10-CM

## 2020-06-12 MED ORDER — FLUCONAZOLE 150 MG PO TABS: 150.0000 mg | ORAL_TABLET | ORAL | 0 refills | Status: DC | PRN

## 2020-06-12 MED FILL — FLUCONAZOLE 150 MG TABS: 150 | 9 days supply | Qty: 3 | Fill #0

## 2020-06-12 NOTE — Progress Notes (Signed)

## 2020-06-18 MED FILL — TRIAMCIN/CERAVE 0.025% 1:1: 90 days supply | Qty: 454 | Fill #0

## 2020-06-18 MED FILL — TRETINOIN 0.1% CREAM: 0.1 | 20 days supply | Qty: 20 | Fill #0

## 2020-07-02 ENCOUNTER — Telehealth: Payer: No Typology Code available for payment source | Admitting: Emergency Medicine

## 2020-07-02 DIAGNOSIS — R21 Rash and other nonspecific skin eruption: Secondary | ICD-10-CM | POA: Diagnosis not present

## 2020-07-02 MED ORDER — PREDNISONE 20 MG PO TABS: 20.0000 mg | ORAL_TABLET | Freq: Every day | ORAL | 0 refills | Status: AC

## 2020-07-02 MED FILL — predniSONE 20 MG TABS: 20 | 4 days supply | Qty: 4 | Fill #0

## 2020-07-02 NOTE — Progress Notes (Signed)
E Visit for Rash  We are sorry that you are not feeling well. Here is how we plan to help!  Based on what you shared with me it looks like you have contact dermatitis.  Contact dermatitis is a skin rash caused by something that touches the skin and causes irritation or inflammation.  Your skin may be red, swollen, dry, cracked, and itch.  The rash should go away in a few days but can last a few weeks.  If you get a rash, it's important to figure out what caused it so the irritant can be avoided in the future.  Prednisone 20 mg daily for 4 days.   HOME CARE:   Take cool showers and avoid direct sunlight.  Apply cool compress or wet dressings.  Take a bath in an oatmeal bath.  Sprinkle content of one Aveeno packet under running faucet with comfortably warm water.  Bathe for 15-20 minutes, 1-2 times daily.  Pat dry with a towel. Do not rub the rash.  Use hydrocortisone cream.  Take an antihistamine like Benadryl for widespread rashes that itch.  The adult dose of Benadryl is 25-50 mg by mouth 4 times daily.  Caution:  This type of medication may cause sleepiness.  Do not drink alcohol, drive, or operate dangerous machinery while taking antihistamines.  Do not take these medications if you have prostate enlargement.  Read package instructions thoroughly on all medications that you take.  GET HELP RIGHT AWAY IF:   Symptoms don't go away after treatment.  Severe itching that persists.  If you rash spreads or swells.  If you rash begins to smell.  If it blisters and opens or develops a yellow-brown crust.  You develop a fever.  You have a sore throat.  You become short of breath.  MAKE SURE YOU:  Understand these instructions. Will watch your condition. Will get help right away if you are not doing well or get worse.  Thank you for choosing an e-visit. Your e-visit answers were reviewed by a board certified advanced clinical practitioner to complete your personal care plan.  Depending upon the condition, your plan could have included both over the counter or prescription medications. Please review your pharmacy choice. Be sure that the pharmacy you have chosen is open so that you can pick up your prescription now.  If there is a problem you may message your provider in [place] to have the prescription routed to another pharmacy. Your safety is important to [place]. If you have drug allergies check your prescription carefully.  For the next 24 hours, you can use MyChart to ask questions about today's visit, request a non-urgent call back, or ask for a work or school excuse from your e-visit provider. You will get an email in the next two days asking about your experience. I hope that your e-visit has been valuable and will speed your recovery.  I spent 10 minutes reviewing patient's chard and medical information.

## 2020-07-16 ENCOUNTER — Telehealth: Payer: No Typology Code available for payment source | Admitting: Family

## 2020-07-16 ENCOUNTER — Ambulatory Visit: Payer: No Typology Code available for payment source

## 2020-07-16 ENCOUNTER — Ambulatory Visit: Payer: No Typology Code available for payment source | Attending: Critical Care Medicine

## 2020-07-16 ENCOUNTER — Other Ambulatory Visit: Payer: Self-pay

## 2020-07-16 DIAGNOSIS — N760 Acute vaginitis: Secondary | ICD-10-CM | POA: Diagnosis not present

## 2020-07-16 DIAGNOSIS — Z23 Encounter for immunization: Secondary | ICD-10-CM

## 2020-07-16 MED ORDER — METRONIDAZOLE 500 MG PO TABS: 500.0000 mg | ORAL_TABLET | Freq: Two times a day (BID) | ORAL | 0 refills | Status: DC

## 2020-07-16 MED FILL — metroNIDAZOLE 500 MG TABS: 500 | 7 days supply | Qty: 14 | Fill #0

## 2020-07-16 NOTE — Progress Notes (Signed)

## 2020-07-16 NOTE — Progress Notes (Signed)
° °  Covid-19 Vaccination Clinic  Name:  Kimberly Cooper    MRN: [id number] DOB: Mar 11, 1991  07/16/2020  Kimberly Cooper was observed post Covid-19 immunization for 30 minutes based on pre-vaccination screening without incident. She was provided with Vaccine Information Sheet and instruction to access the V-Safe system.   Kimberly Cooper was instructed to call 911 with any severe reactions post vaccine:  Difficulty breathing   Swelling of face and throat   A fast heartbeat   A bad rash all over body   Dizziness and weakness   Immunizations Administered    Name Date Dose VIS Date Route   Pfizer COVID-19 Vaccine 07/16/2020  1:37 PM 0.3 mL 02/14/2019 Intramuscular   Manufacturer: [place]   Lot: [id number]   [place]: [address]-1

## 2020-08-06 ENCOUNTER — Ambulatory Visit: Payer: No Typology Code available for payment source | Attending: Internal Medicine

## 2020-08-06 DIAGNOSIS — Z23 Encounter for immunization: Secondary | ICD-10-CM

## 2020-08-06 NOTE — Progress Notes (Signed)
   Covid-19 Vaccination Clinic  Name:  Kimberly Cooper    MRN: [id number] DOB: 1991/02/14  08/06/2020  [name] was observed post Covid-19 immunization for 30 minutes based on pre-vaccination screening without incident. She was provided with Vaccine Information Sheet and instruction to access the V-Safe system.   [name] was instructed to call 911 with any severe reactions post vaccine: Marland Kitchen Difficulty breathing  . Swelling of face and throat  . A fast heartbeat  . A bad rash all over body  . Dizziness and weakness   Immunizations Administered    Name Date Dose VIS Date Route   Pfizer COVID-19 Vaccine 08/06/2020 10:28 AM 0.3 mL 02/14/2019 Intramuscular   Manufacturer: [employer]   Lot: [id number]   [place]: [address]-6

## 2020-09-03 MED FILL — HYDROQUINONE 4% CREAM: 4 | 14 days supply | Qty: 28 | Fill #1

## 2020-09-17 MED FILL — DAPSONE 5% GEL: 5 | 30 days supply | Qty: 60 | Fill #0

## 2020-09-20 MED FILL — HYDROQUINONE 4% CREAM: 4 | 14 days supply | Qty: 28 | Fill #1

## 2020-11-21 ENCOUNTER — Other Ambulatory Visit (HOSPITAL_COMMUNITY): Payer: Self-pay | Admitting: Physician Assistant

## 2020-11-21 MED FILL — KETOCONAZOLE 2% CREAM: 2 | 15 days supply | Qty: 30 | Fill #0

## 2020-12-30 ENCOUNTER — Other Ambulatory Visit: Payer: Self-pay | Admitting: Family

## 2020-12-30 ENCOUNTER — Telehealth: Payer: No Typology Code available for payment source | Admitting: Family

## 2020-12-30 DIAGNOSIS — R197 Diarrhea, unspecified: Secondary | ICD-10-CM | POA: Diagnosis not present

## 2020-12-30 DIAGNOSIS — U071 COVID-19: Secondary | ICD-10-CM | POA: Diagnosis not present

## 2020-12-30 MED ORDER — ONDANSETRON HCL 4 MG PO TABS: 4.0000 mg | ORAL_TABLET | Freq: Three times a day (TID) | ORAL | 0 refills | Status: DC | PRN

## 2020-12-30 MED FILL — ONDANSETRON HCL 4 MG TABLET: 4 | 7 days supply | Qty: 20 | Fill #0

## 2020-12-30 NOTE — Progress Notes (Signed)

## 2021-01-27 ENCOUNTER — Other Ambulatory Visit: Payer: Self-pay | Admitting: Physician Assistant

## 2021-01-27 ENCOUNTER — Telehealth: Payer: No Typology Code available for payment source | Admitting: Physician Assistant

## 2021-01-27 DIAGNOSIS — J208 Acute bronchitis due to other specified organisms: Secondary | ICD-10-CM | POA: Diagnosis not present

## 2021-01-27 MED ORDER — BENZONATATE 100 MG PO CAPS
100.0000 mg | ORAL_CAPSULE | Freq: Three times a day (TID) | ORAL | 0 refills | Status: DC | PRN
Start: 2021-01-27 — End: 2021-01-27

## 2021-01-27 MED FILL — BENZONATATE 100 MG CAPS: 100 | 10 days supply | Qty: 30 | Fill #0

## 2021-01-27 NOTE — Progress Notes (Signed)

## 2021-01-27 NOTE — Progress Notes (Signed)
I have spent 5 minutes in review of e-visit questionnaire, review and updating patient chart, medical decision making and response to patient.   Timohty Renbarger Cody Kristin Barcus, PA-C    

## 2021-02-19 ENCOUNTER — Other Ambulatory Visit: Payer: Self-pay | Admitting: Nurse Practitioner

## 2021-02-19 ENCOUNTER — Telehealth: Payer: No Typology Code available for payment source | Admitting: Nurse Practitioner

## 2021-02-19 DIAGNOSIS — B001 Herpesviral vesicular dermatitis: Secondary | ICD-10-CM

## 2021-02-19 MED ORDER — VALACYCLOVIR HCL 1 G PO TABS
ORAL_TABLET | ORAL | 0 refills | Status: DC
Start: 2021-02-19 — End: 2021-02-19

## 2021-02-19 MED FILL — valACYclovir HCL 1 GM TABS: 1 | 2 days supply | Qty: 8 | Fill #0

## 2021-02-19 NOTE — Progress Notes (Signed)
We are sorry that you are not feeling well.  Here is how we plan to help!  Based on what you have shared with me it does look like you have a viral infection.    Most cold sores or fever blisters are small fluid filled blisters around the mouth caused by herpes simplex virus.  The most common strain of the virus causing cold sores is herpes simplex virus 1.  It can be spread by skin contact, sharing eating utensils, or even sharing towels.  Cold sores are contagious to other people until dry. (Approximately 5-7 days).  Wash your hands. You can spread the virus to your eyes through handling your contact lenses after touching the lesions.  Most people experience pain at the sight or tingling sensations in their lips that may begin before the ulcers erupt.  Herpes simplex is treatable but not curable.  It may lie dormant for a long time and then reappear due to stress or prolonged sun exposure.  Many patients have success in treating their cold sores with an over the counter topical called Abreva.  You may apply the cream up to 5 times daily (maximum 10 days) until healing occurs.  If you would like to use an oral antiviral medication to speed the healing of your cold sore, I have sent a prescription to your local pharmacy Valacyclovir 2 gm take one by mouth twice a day for 1 day    HOME CARE:  Wash your hands frequently. Do not pick at or rub the sore. Don't open the blisters. Avoid kissing other people during this time. Avoid sharing drinking glasses, eating utensils, or razors. Do not handle contact lenses unless you have thoroughly washed your hands with soap and warm water! Avoid oral sex during this time.  Herpes from sores on your mouth can spread to your partner's genital area. Avoid contact with anyone who has eczema or a weakened immune system. Cold sores are often triggered by exposure to intense sunlight, use a lip balm containing a sunscreen (SPF 30 or higher).  GET HELP RIGHT AWAY  IF:  Blisters look infected. Blisters occur near or in the eye. Symptoms last longer than 10 days. Your symptoms become worse.  MAKE SURE YOU:  Understand these instructions. Will watch your condition. Will get help right away if you are not doing well or get worse.    Your e-visit answers were reviewed by a board certified advanced clinical practitioner to complete your personal care plan.  Depending upon the condition, your plan could have  Included both over the counter or prescription medications.    Please review your pharmacy choice.  Be sure that the pharmacy you have chosen is open so that you can pick up your prescription now.  If there is a problem you can message your provider in MyChart to have the prescription routed to another pharmacy.    Your safety is important to us.  If you have drug allergies check our prescription carefully.  For the next 24 hours you can use MyChart to ask questions about today's visit, request a non-urgent call back, or ask for a work or school excuse from your e-visit provider.  You will get an email in the next two days asking about your experience.  I hope that your e-visit has been valuable and will speed your recovery.  5-10 minutes spent reviewing and documenting in chart.     chart.

## 2021-02-24 ENCOUNTER — Telehealth: Payer: No Typology Code available for payment source | Admitting: Family

## 2021-02-24 ENCOUNTER — Other Ambulatory Visit: Payer: Self-pay | Admitting: Family

## 2021-02-24 DIAGNOSIS — L739 Follicular disorder, unspecified: Secondary | ICD-10-CM

## 2021-02-24 MED ORDER — DOXYCYCLINE HYCLATE 100 MG PO TABS
100.0000 mg | ORAL_TABLET | Freq: Two times a day (BID) | ORAL | 0 refills | Status: DC
Start: 2021-02-24 — End: 2021-02-24

## 2021-02-24 MED FILL — DOXYCYCLINE HYCLATE 100 MG: 100 | 10 days supply | Qty: 20 | Fill #0

## 2021-02-24 NOTE — Progress Notes (Signed)
E Visit for Rash  We are sorry that you are not feeling well. Here is how we plan to help!    Based upon what you have shared with me it looks like you have a bacterial follicultits.  Folliculitis is inflammation of the hair follicles that can be caused by a superficial infection of the skin and is treated with an antibiotic. I have prescribed: and Doxycycline 100 mg twice per day for 7 days     HOME CARE:   Take cool showers and avoid direct sunlight.  Apply cool compress or wet dressings.  Take a bath in an oatmeal bath.  Sprinkle content of one Aveeno packet under running faucet with comfortably warm water.  Bathe for 15-20 minutes, 1-2 times daily.  Pat dry with a towel. Do not rub the rash.  Use hydrocortisone cream.  Take an antihistamine like Benadryl for widespread rashes that itch.  The adult dose of Benadryl is 25-50 mg by mouth 4 times daily.  Caution:  This type of medication may cause sleepiness.  Do not drink alcohol, drive, or operate dangerous machinery while taking antihistamines.  Do not take these medications if you have prostate enlargement.  Read package instructions thoroughly on all medications that you take.  GET HELP RIGHT AWAY IF:   Symptoms don't go away after treatment.  Severe itching that persists.  If you rash spreads or swells.  If you rash begins to smell.  If it blisters and opens or develops a yellow-brown crust.  You develop a fever.  You have a sore throat.  You become short of breath.  MAKE SURE YOU:  Understand these instructions. Will watch your condition. Will get help right away if you are not doing well or get worse.  Thank you for choosing an e-visit. Your e-visit answers were reviewed by a board certified advanced clinical practitioner to complete your personal care plan. Depending upon the condition, your plan could have included both over the counter or prescription medications. Please review your pharmacy choice. Be  sure that the pharmacy you have chosen is open so that you can pick up your prescription now.  If there is a problem you may message your provider in [place] to have the prescription routed to another pharmacy. Your safety is important to [place]. If you have drug allergies check your prescription carefully.  For the next 24 hours, you can use MyChart to ask questions about today's visit, request a non-urgent call back, or ask for a work or school excuse from your e-visit provider. You will get an email in the next two days asking about your experience. I hope that your e-visit has been valuable and will speed your recovery.

## 2021-02-26 ENCOUNTER — Encounter: Payer: No Typology Code available for payment source | Admitting: Family Medicine

## 2021-02-27 ENCOUNTER — Telehealth: Payer: No Typology Code available for payment source | Admitting: Physician Assistant

## 2021-02-27 ENCOUNTER — Other Ambulatory Visit: Payer: Self-pay | Admitting: Physician Assistant

## 2021-02-27 DIAGNOSIS — N760 Acute vaginitis: Secondary | ICD-10-CM

## 2021-02-27 MED ORDER — FLUCONAZOLE 150 MG PO TABS
150.0000 mg | ORAL_TABLET | Freq: Once | ORAL | 0 refills | Status: DC
Start: 2021-02-27 — End: 2021-02-27

## 2021-02-27 MED FILL — FLUCONAZOLE 150 MG TABS: 150 | 1 days supply | Qty: 1 | Fill #0

## 2021-02-27 NOTE — Progress Notes (Signed)
I have spent 5 minutes in review of e-visit questionnaire, review and updating patient chart, medical decision making and response to patient.   Katelyn Kohlmeyer Cody Eligha Kmetz, PA-C    

## 2021-02-27 NOTE — Progress Notes (Signed)

## 2021-03-15 ENCOUNTER — Encounter: Payer: Self-pay | Admitting: Nurse Practitioner

## 2021-03-15 ENCOUNTER — Telehealth: Payer: No Typology Code available for payment source | Admitting: Nurse Practitioner

## 2021-03-15 DIAGNOSIS — R21 Rash and other nonspecific skin eruption: Secondary | ICD-10-CM

## 2021-03-15 DIAGNOSIS — L739 Follicular disorder, unspecified: Secondary | ICD-10-CM | POA: Diagnosis not present

## 2021-03-15 NOTE — Progress Notes (Signed)
Kimberly Cooper, [place] Cooper are scheduled for a virtual Cooper with your provider today.    Just as we do with appointments in [place] office, we must obtain your consent to participate.  Your consent will be active for this Cooper and any virtual Cooper you may have with one of our providers in [place] next 365 days.    If you have a MyChart account, I can also send a copy of this consent to you electronically.  All virtual visits are billed to your insurance company just like a traditional Cooper in [place] office.  As this is a virtual Cooper, video technology does not allow for your provider to perform a traditional examination.  This may limit your provider's ability to fully assess your condition.  If your provider identifies any concerns that need to be evaluated in person or [place] need to arrange testing such as labs, EKG, etc, we will make arrangements to do so.    Although advances in technology are sophisticated, we cannot ensure that it will always work on either your end or our end.  If [place] connection with a video Cooper is poor, we may have to switch to a telephone Cooper.  With either a video or telephone Cooper, we are not always able to ensure that we have a secure connection.   I need to obtain your verbal consent now.   Are you willing to proceed with your Cooper today?   Kimberly Cooper has provided verbal consent on 03/15/2021 for a virtual Cooper (video or telephone).   [place], [place] 03/15/2021  12:06 PM   Virtual Cooper via video Note   Due to COVID-19 pandemic this Cooper was conducted virtually. This Cooper type was conducted due to national recommendations for restrictions regarding [place] COVID-19 Pandemic (e.g. social distancing, sheltering in place) in an effort to limit this Cooper's exposure and mitigate transmission in our community. All issues noted in this document were discussed and addressed.  A physical exam was not performed with this format.  I connected with  Kim[place] Cooper  on 03/15/21  at 11:55 by video and verified that I am speaking with [place] correct person using two identifiers. [place] Cooper is currently located at home and no one is currently with  [place] Cooper. [place] provider, Mary-Margaret Hassell Done, FNP is located in their office at time of Cooper.  I discussed [place] limitations, risks, security and privacy concerns of performing an evaluation and management service by video  and [place] availability of in person appointments. I also discussed with [place] Cooper that there may be a Cooper responsible charge related to this service. [place] Cooper expressed understanding and agreed to proceed.   History and Present Illness:   Chief Complaint: Rash   HPI Cooper does video Cooper after being told on evisit that she needs a face to face Cooper. She was diagnosed several weeks ago with folliculitis and was give triamcinolone cream and antibiotic. [place] antibiotic caused yeast infection so she stopped taking it. It seemed to help some. But this week [place] Cooper upper thighs started itching with same type of finely raised bumps. They are not red, do not hurt and have no drainage. She had an appointment with dermatology that was cancelled and [place] Cooper primary care provider office is no longer open.   Review of Systems  Constitutional: Negative.   Respiratory: Negative.   Cardiovascular: Negative.   Genitourinary: Negative.   Neurological: Negative.   Psychiatric/Behavioral: Negative.   All other systems reviewed and  are negative.      Observations/Objective: Alert and oriented- answers all questions appropriately No distress Fine raised bumps on bil upper thighs- do not appear redin video.   Assessment and Plan: [place] Pott Bruun in today with chief complaint of Rash   1. Chronic folliculitis Continue trimacinolone cream topically. Mix with eucerin cream and spread on legs while still wet. Avoid scratching Avoid hot showers See dermatology if worsens     Follow Up  Instructions: prn    I discussed [place] assessment and treatment plan with Kimber[place] Cooper. [place] Cooper was provided an opportunity to ask questions and all were answered. [place] Cooper agreed with [place] plan and demonstrated an understanding of [place] instructions.   Kim[place] Cooper was advised to call back or seek an in-person evaluation if [place] symptoms worsen or if [place] condition fails to improve as anticipated.  [place] above assessment and management plan was discussed with [place] Cooper. [place] Cooper verbalized understanding of and has agreed to [place] management plan. Cooper is aware to call [place] clinic if symptoms persist or worsen. Cooper is aware when to return to [place] clinic for a follow-up Cooper. Cooper educated on when it is appropriate to go to [place] emergency department.   Time call ended:12:12  I provided 17 minutes of face-to-face time during this encounter.    Mary-Margaret Hassell Done, FNP

## 2021-03-15 NOTE — Progress Notes (Signed)
Based on what you shared with me it looks like you have rash,that should be evaluated in a face to face office visit. Based on yur answers I cannot correctly diagnose what is going on.     NOTE: If you entered your credit card information for this eVisit, you will not be charged. You may see a "hold" on your card for the $35 but that hold will drop off and you will not have a charge processed.  If you are having a true medical emergency please call 911.     For an urgent face to face visit, [place] has four urgent care centers for your convenience:   . [facility]    [phone]                  Get Driving Directions  [address], [place] [address] . 10 am to 8 pm Monday-Friday . 12 pm to 8 pm Saturday-Sunday   . [facility] at [place]                  Get Driving Directions  [address], [place], [place] [address] . 8 am to 8 pm Monday-Friday . 9 am to 6 pm Saturday . 11 am to 6 pm Sunday   . [facility] at [place]                  Get Driving Directions   [address] [place], [place] [address] . 8 am to 8 pm Monday-Friday . 8 am to 4 pm Saturday-Sunday    . [facility] at [place]                    Get Driving Directions  [phone]  [address] [place],  [address]  . Monday-Friday, 12 PM to 6 PM    Your e-visit answers were reviewed by a board certified advanced clinical practitioner to complete your personal care plan.  Thank you for using e-Visits.

## 2021-04-23 ENCOUNTER — Other Ambulatory Visit (HOSPITAL_COMMUNITY): Payer: Self-pay

## 2021-04-23 MED ORDER — TRETINOIN 0.1 % EX CREA
TOPICAL_CREAM | CUTANEOUS | 1 refills | Status: DC
  Filled 2021-04-23 – 2021-05-01 (×3): qty 20, 30d supply, fill #0
  Filled 2021-05-28: qty 20, 20d supply, fill #0

## 2021-04-24 ENCOUNTER — Other Ambulatory Visit (HOSPITAL_COMMUNITY): Payer: Self-pay

## 2021-04-30 ENCOUNTER — Other Ambulatory Visit (HOSPITAL_COMMUNITY): Payer: Self-pay

## 2021-05-01 ENCOUNTER — Other Ambulatory Visit (HOSPITAL_COMMUNITY): Payer: Self-pay

## 2021-05-06 ENCOUNTER — Other Ambulatory Visit (HOSPITAL_COMMUNITY): Payer: Self-pay

## 2021-05-08 ENCOUNTER — Other Ambulatory Visit (HOSPITAL_COMMUNITY): Payer: Self-pay

## 2021-05-08 MED ORDER — HYDROQUINONE 4 % EX CREA
TOPICAL_CREAM | CUTANEOUS | 1 refills | Status: DC
  Filled 2021-05-08: qty 56.7, 30d supply, fill #0
  Filled 2022-03-04: qty 56.7, 30d supply, fill #1

## 2021-05-08 MED ORDER — TRETINOIN 0.1 % EX CREA
TOPICAL_CREAM | CUTANEOUS | 2 refills | Status: DC
  Filled 2021-05-08: qty 45, 30d supply, fill #0
  Filled 2021-05-13 – 2022-03-04 (×2): qty 45, 20d supply, fill #0

## 2021-05-09 ENCOUNTER — Other Ambulatory Visit (HOSPITAL_COMMUNITY): Payer: Self-pay

## 2021-05-10 ENCOUNTER — Other Ambulatory Visit (HOSPITAL_COMMUNITY): Payer: Self-pay

## 2021-05-12 ENCOUNTER — Other Ambulatory Visit (HOSPITAL_COMMUNITY): Payer: Self-pay

## 2021-05-13 ENCOUNTER — Other Ambulatory Visit (HOSPITAL_COMMUNITY): Payer: Self-pay

## 2021-05-16 ENCOUNTER — Telehealth: Payer: No Typology Code available for payment source | Admitting: Physician Assistant

## 2021-05-16 ENCOUNTER — Other Ambulatory Visit (HOSPITAL_COMMUNITY): Payer: Self-pay

## 2021-05-16 DIAGNOSIS — B001 Herpesviral vesicular dermatitis: Secondary | ICD-10-CM

## 2021-05-16 MED ORDER — VALACYCLOVIR HCL 1 G PO TABS
2000.0000 mg | ORAL_TABLET | Freq: Two times a day (BID) | ORAL | 0 refills | Status: AC
  Filled 2021-05-16: qty 4, 1d supply, fill #0

## 2021-05-16 NOTE — Progress Notes (Signed)

## 2021-05-16 NOTE — Progress Notes (Signed)
I have spent 5 minutes in review of e-visit questionnaire, review and updating patient chart, medical decision making and response to patient.   Asar Evilsizer Cody Affan Callow, PA-C    

## 2021-05-24 ENCOUNTER — Other Ambulatory Visit (HOSPITAL_COMMUNITY): Payer: Self-pay

## 2021-05-28 ENCOUNTER — Other Ambulatory Visit (HOSPITAL_COMMUNITY): Payer: Self-pay

## 2021-05-29 ENCOUNTER — Other Ambulatory Visit (HOSPITAL_COMMUNITY): Payer: Self-pay

## 2021-05-29 MED ORDER — TRIAMCINOLONE ACETONIDE 0.025 % EX CREA
1.0000 "application " | TOPICAL_CREAM | Freq: Two times a day (BID) | CUTANEOUS | 1 refills | Status: DC
  Filled 2021-05-29: qty 454, 90d supply, fill #0
  Filled 2021-05-29: qty 454, 30d supply, fill #0

## 2021-05-30 ENCOUNTER — Other Ambulatory Visit (HOSPITAL_COMMUNITY): Payer: Self-pay

## 2021-06-10 ENCOUNTER — Ambulatory Visit (INDEPENDENT_AMBULATORY_CARE_PROVIDER_SITE_OTHER): Payer: No Typology Code available for payment source | Admitting: Nurse Practitioner

## 2021-06-10 ENCOUNTER — Other Ambulatory Visit (HOSPITAL_COMMUNITY)
Admission: RE | Admit: 2021-06-10 | Discharge: 2021-06-10 | Disposition: A | Discharge status: Home / Self Care | Payer: No Typology Code available for payment source | Source: Ambulatory Visit | Attending: Nurse Practitioner | Admitting: Nurse Practitioner

## 2021-06-10 ENCOUNTER — Other Ambulatory Visit: Payer: Self-pay

## 2021-06-10 ENCOUNTER — Encounter: Payer: Self-pay | Admitting: Nurse Practitioner

## 2021-06-10 VITALS — BP 128/70 | HR 90 | Temp 98.1°F | Ht 60.4 in | Wt 175.8 lb

## 2021-06-10 DIAGNOSIS — Z0000 Encounter for general adult medical examination without abnormal findings: Secondary | ICD-10-CM | POA: Diagnosis present

## 2021-06-10 DIAGNOSIS — E6609 Other obesity due to excess calories: Secondary | ICD-10-CM | POA: Diagnosis not present

## 2021-06-10 DIAGNOSIS — E559 Vitamin D deficiency, unspecified: Secondary | ICD-10-CM

## 2021-06-10 DIAGNOSIS — Z1211 Encounter for screening for malignant neoplasm of colon: Secondary | ICD-10-CM | POA: Diagnosis not present

## 2021-06-10 DIAGNOSIS — Z7689 Persons encountering health services in other specified circumstances: Secondary | ICD-10-CM

## 2021-06-10 DIAGNOSIS — Z6833 Body mass index (BMI) 33.0-33.9, adult: Secondary | ICD-10-CM

## 2021-06-10 DIAGNOSIS — Z1231 Encounter for screening mammogram for malignant neoplasm of breast: Secondary | ICD-10-CM

## 2021-06-10 LAB — HM MAMMOGRAPHY

## 2021-06-10 LAB — HM PAP SMEAR

## 2021-06-10 NOTE — Patient Instructions (Signed)
Health Maintenance, Female Adopting a healthy lifestyle and getting preventive care are important in promoting health and wellness. Ask your health care provider about: The right schedule for you to have regular tests and exams. Things you can do on your own to prevent diseases and keep yourself healthy. What should I know about diet, weight, and exercise? Eat a healthy diet  Eat a diet that includes plenty of vegetables, fruits, low-fat dairy products, and lean protein. Do not eat a lot of foods that are high in solid fats, added sugars, or sodium.  Maintain a healthy weight Body mass index (BMI) is used to identify weight problems. It estimates body fat based on height and weight. Your health care provider can help determineyour BMI and help you achieve or maintain a healthy weight. Get regular exercise Get regular exercise. This is one of the most important things you can do for your health. Most adults should: Exercise for at least 150 minutes each week. The exercise should increase your heart rate and make you sweat (moderate-intensity exercise). Do strengthening exercises at least twice a week. This is in addition to the moderate-intensity exercise. Spend less time sitting. Even light physical activity can be beneficial. Watch cholesterol and blood lipids Have your blood tested for lipids and cholesterol at 30 years of age, then havethis test every 5 years. Have your cholesterol levels checked more often if: Your lipid or cholesterol levels are high. You are older than 30 years of age. You are at high risk for heart disease. What should I know about cancer screening? Depending on your health history and family history, you may need to have cancer screening at various ages. This may include screening for: Breast cancer. Cervical cancer. Colorectal cancer. Skin cancer. Lung cancer. What should I know about heart disease, diabetes, and high blood pressure? Blood pressure and heart  disease High blood pressure causes heart disease and increases the risk of stroke. This is more likely to develop in people who have high blood pressure readings, are of African descent, or are overweight. Have your blood pressure checked: Every 3-5 years if you are 18-39 years of age. Every year if you are 40 years old or older. Diabetes Have regular diabetes screenings. This checks your fasting blood sugar level. Have the screening done: Once every three years after age 40 if you are at a normal weight and have a low risk for diabetes. More often and at a younger age if you are overweight or have a high risk for diabetes. What should I know about preventing infection? Hepatitis B If you have a higher risk for hepatitis B, you should be screened for this virus. Talk with your health care provider to find out if you are at risk forhepatitis B infection. Hepatitis C Testing is recommended for: Everyone born from 1945 through 1965. Anyone with known risk factors for hepatitis C. Sexually transmitted infections (STIs) Get screened for STIs, including gonorrhea and chlamydia, if: You are sexually active and are younger than 30 years of age. You are older than 30 years of age and your health care provider tells you that you are at risk for this type of infection. Your sexual activity has changed since you were last screened, and you are at increased risk for chlamydia or gonorrhea. Ask your health care provider if you are at risk. Ask your health care provider about whether you are at high risk for HIV. Your health care provider may recommend a prescription medicine to help   prevent HIV infection. If you choose to take medicine to prevent HIV, you should first get tested for HIV. You should then be tested every 3 months for as long as you are taking the medicine. Pregnancy If you are about to stop having your period (premenopausal) and you may become pregnant, seek counseling before you get  pregnant. Take 400 to 800 micrograms (mcg) of folic acid every day if you become pregnant. Ask for birth control (contraception) if you want to prevent pregnancy. Osteoporosis and menopause Osteoporosis is a disease in which the bones lose minerals and strength with aging. This can result in bone fractures. If you are 65 years old or older, or if you are at risk for osteoporosis and fractures, ask your health care provider if you should: Be screened for bone loss. Take a calcium or vitamin D supplement to lower your risk of fractures. Be given hormone replacement therapy (HRT) to treat symptoms of menopause. Follow these instructions at home: Lifestyle Do not use any products that contain nicotine or tobacco, such as cigarettes, e-cigarettes, and chewing tobacco. If you need help quitting, ask your health care provider. Do not use street drugs. Do not share needles. Ask your health care provider for help if you need support or information about quitting drugs. Alcohol use Do not drink alcohol if: Your health care provider tells you not to drink. You are pregnant, may be pregnant, or are planning to become pregnant. If you drink alcohol: Limit how much you use to 0-1 drink a day. Limit intake if you are breastfeeding. Be aware of how much alcohol is in your drink. In the [place] one drink equals one 12 oz bottle of beer (355 mL), one 5 oz glass of wine (148 mL), or one 1 oz glass of hard liquor (44 mL). General instructions Schedule regular health, dental, and eye exams. Stay current with your vaccines. Tell your health care provider if: You often feel depressed. You have ever been abused or do not feel safe at home. Summary Adopting a healthy lifestyle and getting preventive care are important in promoting health and wellness. Follow your health care provider's instructions about healthy diet, exercising, and getting tested or screened for diseases. Follow your health care provider's  instructions on monitoring your cholesterol and blood pressure. This information is not intended to replace advice given to you by your health care provider. Make sure you discuss any questions you have with your healthcare provider. Document Revised: 11/30/2018 Document Reviewed: 11/30/2018 Elsevier Patient Education  2022 Elsevier Inc.  

## 2021-06-10 NOTE — Progress Notes (Signed)
I,Tianna Badgett,acting as a Education administrator for [employer], NP.,have documented all relevant documentation on the behalf of [employer], NP,as directed by  Bary Castilla, NP while in the presence of Bary Castilla, NP.  This visit occurred during the SARS-CoV-2 public health emergency.  Safety protocols were in place, including screening questions prior to the visit, additional usage of staff PPE, and extensive cleaning of exam room while observing appropriate contact time as indicated for disinfecting solutions.  Subjective:     Patient ID: Kimberly Cooper , female    DOB: 11-23-1991 , 30 y.o.   MRN: [id number]   Chief Complaint  Patient presents with   Establish Care    HPI  Patient is here to establish care. She is a Engineer, building services for [employer]. She has family history of cancer and would like to discuss options for screening for breast cancer and cancers in general.  Breast cancer and colon cancer are common in her family.  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 She drinks a lot of water.   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 She uses condoms.      Past Medical History:  Diagnosis Date   Allergy    Chlamydia    GERD (gastroesophageal reflux disease)    Gonorrhea    MRSA infection greater than 3 months ago 2003   ABDOMINAL CYST     Family History  Problem Relation Age of Onset   Hypertension Mother    Heart disease Father 47       MI   Other Father        gunshot, paralysis   Cancer Maternal Grandfather        lung/prostate   Stroke Maternal Grandmother    Cancer Paternal Aunt 51       stomach cancer     Current Outpatient Medications:    hydroquinone 4 % cream, Apply a small amount to affected area twice a day as needed until desired color is achieved, Disp: 56.7 g, Rfl: 1   ketoconazole (NIZORAL) 2 % cream, APPLY 2 TIMES A DAY FOR 3 WEEKS AS NEEDED FOR FLARES, Disp: 30 g, Rfl: 1   tretinoin (RETIN-A) 0.1 % cream, Apply to face every  evening as tolerated, Disp: 20 g, Rfl: 1   tretinoin (RETIN-A) 0.1 % cream, Apply pea size amount to face nightly followed by moisturizer, Disp: 45 g, Rfl: 2   triamcinolone-CeraVe, Apply 1 application topically 2 (two) times daily to affected areas as needed., Disp: 454 g, Rfl: 1   Allergies  Allergen Reactions   Other Hives    All Raw Nuts: Swelling   Penicillins Hives      The patient states she uses condoms for birth control. Last LMP was Patient's last menstrual period was 06/03/2021 (exact date).. Negative for Dysmenorrhea. Negative for: breast discharge, breast lump(s), breast pain and breast self exam. Associated symptoms include abnormal vaginal bleeding. Pertinent negatives include abnormal bleeding (hematology), anxiety, decreased libido, depression, difficulty falling sleep, dyspareunia, history of infertility, nocturia, sexual dysfunction, sleep disturbances, urinary incontinence, urinary urgency, vaginal discharge and vaginal itching. Diet regular.The patient states her exercise level is    . The patient's tobacco use is:  Social History   Tobacco Use  Smoking Status Never  Smokeless Tobacco Never  . She has been exposed to passive smoke. The patient's alcohol use is:  Social History   Substance and Sexual Activity  Alcohol Use Yes   Alcohol/week: 2.0 standard  drinks   Types: 1 Glasses of wine, 1 Cans of beer per week   Comment: social  . Additional information: Last pap unknown next one scheduled for today.    Review of Systems  Constitutional: Negative.  Negative for chills and fatigue.  HENT: Negative.  Negative for sinus pressure, sinus pain and sore throat.   Eyes: Negative.   Respiratory: Negative.  Negative for shortness of breath and wheezing.   Cardiovascular: Negative.  Negative for chest pain and palpitations.  Gastrointestinal: Negative.  Negative for constipation and diarrhea.  Endocrine: Negative.  Negative for polydipsia, polyphagia and polyuria.   Genitourinary: Negative.  Negative for menstrual problem and vaginal pain.  Musculoskeletal: Negative.  Negative for arthralgias and myalgias.  Skin: Negative.   Allergic/Immunologic: Negative.   Neurological: Negative.  Negative for dizziness, tremors, weakness, numbness and headaches.  Hematological: Negative.   Psychiatric/Behavioral: Negative.      Today's Vitals   06/10/21 1116  BP: 128/70  Pulse: 90  Temp: 98.1 F (36.7 C)  TempSrc: Oral  Weight: 175 lb 12.8 oz (79.7 kg)  Height: 5' 0.4" (1.534 m)   Body mass index is 33.88 kg/m.   Objective:  Physical Exam Constitutional:      Appearance: Normal appearance. She is obese.  HENT:     Head: Normocephalic and atraumatic.     Right Ear: Tympanic membrane, ear canal and external ear normal.     Left Ear: Tympanic membrane, ear canal and external ear normal.     Nose: No congestion or rhinorrhea.  Cardiovascular:     Rate and Rhythm: Normal rate and regular rhythm.     Pulses: Normal pulses.     Heart sounds: Normal heart sounds. No murmur heard. Pulmonary:     Effort: Pulmonary effort is normal. No respiratory distress.     Breath sounds: Normal breath sounds. No wheezing.  Chest:  Breasts:    Tanner Score is 5.     Right: Normal.     Left: Normal.  Genitourinary:    General: Normal vulva.  Skin:    General: Skin is warm and dry.     Capillary Refill: Capillary refill takes less than 2 seconds.     Findings: Rash present.     Comments: Rash on her upper thighs   Neurological:     Mental Status: She is alert and oriented to person, place, and time.  Psychiatric:        Mood and Affect: Mood normal.        Behavior: Behavior normal.        Assessment And Plan:     1. Establishing care with new doctor, encounter for --Patient is here to establish care. Martin Majestic over patient medical, family, social and surgical history. -Reviewed with patient their medications and any allergies  -Reviewed with patient their  sexual orientation, drug/tobacco and alcohol use -Dicussed any new concerns with patient  -recommended patient comes in for a physical exam and complete blood work.  -Educated patient about the importance of annual screenings and immunizations.  -Advised patient to eat a healthy diet along with exercise for atleast 30-45 min atleast 4-5 days of the week.   2. Encounter for annual physical exam -Patient is here for their annual physical exam and we discussed any changes to medication and medical history.  -Behavior modification was discussed as well as diet and exercise history  -Patient will continue to exercise regularly and modify their diet.  -Recommendation for yearly physical annuals,  immunization and screenings including mammogram and colonoscopy were discussed with the patient.  -Recommended intake of multivitamin, vitamin D and calcium.  -Individualized advise was given to the patient pertaining to their own health history in regards to diet, exercise, medical condition and referrals.  - CBC - Hemoglobin A1c - CMP14+EGFR - Lipid panel - Hepatitis C antibody - HIV Antibody (routine testing w rflx)  3. Vitamin D deficiency -Will check and supplement if needed. Advised patient to spend atleast 15 min. Daily in sunlight.  - Vitamin D (25 hydroxy)  4. Colon cancer screening -Strong history of colon cancer in the immediate family. -Patient would like to be screened  - Cologuard  5. Screening mammogram for breast cancer -Strong history of breast cancer in the immediate family  -Patient would like to be screened for it.  - MM Digital Screening; Future  6. Class 1 obesity due to excess calories without serious comorbidity with body mass index (BMI) of 33.0 to 33.9 in adult Advised patient on a healthy diet including avoiding fast food and red meats. Increase the intake of lean meats including grilled chicken and [place].  Drink a lot of water. Decrease intake of fatty foods. Exercise  for 30-45 min. 4-5 a week to decrease the risk of cardiac event.   The patient was encouraged to call or send a message through [facility] for any questions or concerns.   Follow up: 1 year   Staying healthy and adopting a healthy lifestyle for your overall health is important. You should eat 7 or more servings of fruits and vegetables per day. You should drink plenty of water to keep yourself hydrated and your kidneys healthy. This includes about 65-80+ fluid ounces of water. Limit your intake of animal fats especially for elevated cholesterol. Avoid highly processed food and limit your salt intake if you have hypertension. Avoid foods high in saturated/Trans fats. Along with a healthy diet it is also very important to maintain time for yourself to maintain a healthy mental health with low stress levels. You should get atleast 150 min of moderate intensity exercise weekly for a healthy heart. Along with eating right and exercising, aim for at least 7-9 hours of sleep daily.  Eat more whole grains which includes barley, wheat berries, oats, brown rice and whole wheat pasta. Use healthy plant oils which include olive, soy, corn, sunflower and peanut. Limit your caffeine and sugary drinks. Limit your intake of fast foods. Limit milk and dairy products to one or two daily servings.   Patient was given opportunity to ask questions. Patient verbalized understanding of the plan and was able to repeat key elements of the plan. All questions were answered to their satisfaction.  Raman Omunique Pederson, DNP   I, Raman Kehlani Vancamp have reviewed all documentation for this visit. The documentation on 06/10/21 for the exam, diagnosis, procedures, and orders are all accurate and complete.   THE PATIENT IS ENCOURAGED TO PRACTICE SOCIAL DISTANCING DUE TO THE COVID-19 PANDEMIC.

## 2021-06-11 ENCOUNTER — Other Ambulatory Visit (HOSPITAL_COMMUNITY): Payer: Self-pay

## 2021-06-11 ENCOUNTER — Other Ambulatory Visit: Payer: Self-pay | Admitting: Nurse Practitioner

## 2021-06-11 LAB — CMP14+EGFR
ALT: 23 IU/L (ref 0–32)
AST: 20 IU/L (ref 0–40)
Albumin/Globulin Ratio: 1.2 (ref 1.2–2.2)
Albumin: 4.5 g/dL (ref 3.9–5.0)
Alkaline Phosphatase: 76 IU/L (ref 44–121)
BUN/Creatinine Ratio: 12 (ref 9–23)
BUN: 8 mg/dL (ref 6–20)
Bilirubin Total: 0.2 mg/dL (ref 0.0–1.2)
CO2: 23 mmol/L (ref 20–29)
Calcium: 9.4 mg/dL (ref 8.7–10.2)
Chloride: 98 mmol/L (ref 96–106)
Creatinine, Ser: 0.68 mg/dL (ref 0.57–1.00)
Globulin, Total: 3.7 g/dL (ref 1.5–4.5)
Glucose: 69 mg/dL (ref 65–99)
Potassium: 3.9 mmol/L (ref 3.5–5.2)
Sodium: 137 mmol/L (ref 134–144)
Total Protein: 8.2 g/dL (ref 6.0–8.5)
eGFR: 120 mL/min/{1.73_m2} (ref >59)

## 2021-06-11 LAB — CBC
Hematocrit: 39.6 % (ref 34.0–46.6)
Hemoglobin: 13.2 g/dL (ref 11.1–15.9)
MCH: 29.6 pg (ref 26.6–33.0)
MCHC: 33.3 g/dL (ref 31.5–35.7)
MCV: 89 fL (ref 79–97)
Platelets: 236 10*3/uL (ref 150–450)
RBC: 4.46 x10E6/uL (ref 3.77–5.28)
RDW: 12.2 % (ref 11.7–15.4)
WBC: 7.8 10*3/uL (ref 3.4–10.8)

## 2021-06-11 LAB — HEMOGLOBIN A1C
Est. average glucose Bld gHb Est-mCnc: 91 mg/dL
Hgb A1c MFr Bld: 4.8 % (ref 4.8–5.6)

## 2021-06-11 LAB — VITAMIN D 25 HYDROXY (VIT D DEFICIENCY, FRACTURES): Vit D, 25-Hydroxy: 7.6 ng/mL — ABNORMAL LOW (ref 30.0–100.0)

## 2021-06-11 LAB — HEPATITIS C ANTIBODY: Hep C Virus Ab: <0.1 s/co ratio (ref 0.0–0.9)

## 2021-06-11 LAB — LIPID PANEL
Chol/HDL Ratio: 3.5 ratio (ref 0.0–4.4)
Cholesterol, Total: 187 mg/dL (ref 100–199)
HDL: 53 mg/dL (ref >39)
LDL Chol Calc (NIH): 119 mg/dL — ABNORMAL HIGH (ref 0–99)
Triglycerides: 82 mg/dL (ref 0–149)
VLDL Cholesterol Cal: 15 mg/dL (ref 5–40)

## 2021-06-11 LAB — CYTOLOGY - PAP: Diagnosis: NEGATIVE

## 2021-06-11 LAB — HIV ANTIBODY (ROUTINE TESTING W REFLEX): HIV Screen 4th Generation wRfx: NONREACTIVE

## 2021-06-11 MED ORDER — VITAMIN D (ERGOCALCIFEROL) 1.25 MG (50000 UNIT) PO CAPS
ORAL_CAPSULE | ORAL | 0 refills | Status: DC
  Filled 2021-06-11: qty 24, 84d supply, fill #0

## 2021-07-01 ENCOUNTER — Ambulatory Visit: Payer: No Typology Code available for payment source | Admitting: Nurse Practitioner

## 2021-07-03 ENCOUNTER — Other Ambulatory Visit (HOSPITAL_COMMUNITY): Payer: Self-pay

## 2021-07-03 MED ORDER — FLUOCINONIDE 0.1 % EX CREA
TOPICAL_CREAM | CUTANEOUS | 2 refills | Status: DC
  Filled 2021-07-03: qty 60, 20d supply, fill #0

## 2021-07-03 MED ORDER — LEVOCETIRIZINE DIHYDROCHLORIDE 5 MG PO TABS
5.0000 mg | ORAL_TABLET | Freq: Every evening | ORAL | 0 refills | Status: DC
  Filled 2021-07-03: qty 30, 30d supply, fill #0

## 2021-07-03 MED ORDER — HYDROXYZINE HCL 10 MG PO TABS
10.0000 mg | ORAL_TABLET | Freq: Every evening | ORAL | 0 refills | Status: DC | PRN
  Filled 2021-07-03: qty 30, 10d supply, fill #0

## 2021-07-04 ENCOUNTER — Other Ambulatory Visit (HOSPITAL_COMMUNITY): Payer: Self-pay

## 2021-07-31 ENCOUNTER — Other Ambulatory Visit (HOSPITAL_COMMUNITY): Payer: Self-pay

## 2021-08-08 ENCOUNTER — Other Ambulatory Visit (HOSPITAL_COMMUNITY): Payer: Self-pay

## 2021-08-08 ENCOUNTER — Telehealth: Payer: No Typology Code available for payment source | Admitting: Physician Assistant

## 2021-08-08 DIAGNOSIS — B001 Herpesviral vesicular dermatitis: Secondary | ICD-10-CM | POA: Diagnosis not present

## 2021-08-08 MED ORDER — VALACYCLOVIR HCL 1 G PO TABS
2000.0000 mg | ORAL_TABLET | Freq: Two times a day (BID) | ORAL | 0 refills | Status: DC
  Filled 2021-08-08: qty 4, 1d supply, fill #0

## 2021-08-08 NOTE — Progress Notes (Signed)

## 2021-08-21 ENCOUNTER — Other Ambulatory Visit (HOSPITAL_COMMUNITY): Payer: Self-pay

## 2021-08-21 ENCOUNTER — Other Ambulatory Visit: Payer: Self-pay | Admitting: Nurse Practitioner

## 2021-08-21 DIAGNOSIS — Z889 Allergy status to unspecified drugs, medicaments and biological substances status: Secondary | ICD-10-CM

## 2021-08-21 MED ORDER — EPINEPHRINE 0.3 MG/0.3ML IJ SOAJ
0.3000 mg | INTRAMUSCULAR | 0 refills | Status: DC | PRN
  Filled 2021-08-21: qty 2, 14d supply, fill #0
  Filled 2021-09-04: qty 2, 30d supply, fill #0

## 2021-08-22 ENCOUNTER — Ambulatory Visit
Admission: RE | Admit: 2021-08-22 | Discharge: 2021-08-22 | Disposition: A | Discharge status: Home / Self Care | Payer: No Typology Code available for payment source | Source: Ambulatory Visit | Attending: Nurse Practitioner | Admitting: Nurse Practitioner

## 2021-08-22 ENCOUNTER — Other Ambulatory Visit: Payer: Self-pay

## 2021-08-22 DIAGNOSIS — Z1231 Encounter for screening mammogram for malignant neoplasm of breast: Secondary | ICD-10-CM

## 2021-08-25 ENCOUNTER — Telehealth: Payer: No Typology Code available for payment source | Admitting: Physician Assistant

## 2021-08-25 DIAGNOSIS — B3731 Acute candidiasis of vulva and vagina: Secondary | ICD-10-CM

## 2021-08-25 DIAGNOSIS — B373 Candidiasis of vulva and vagina: Secondary | ICD-10-CM

## 2021-08-25 MED ORDER — FLUCONAZOLE 150 MG PO TABS: 150.0000 mg | ORAL_TABLET | Freq: Once | ORAL | 0 refills | Status: AC

## 2021-08-25 NOTE — Progress Notes (Signed)

## 2021-08-29 ENCOUNTER — Other Ambulatory Visit (HOSPITAL_COMMUNITY): Payer: Self-pay

## 2021-09-04 ENCOUNTER — Other Ambulatory Visit (HOSPITAL_COMMUNITY): Payer: Self-pay

## 2021-09-08 LAB — COLOGUARD

## 2021-10-28 NOTE — Telephone Encounter (Signed)
Yes please. Tell her to come in for an appt.

## 2021-11-07 ENCOUNTER — Telehealth: Payer: No Typology Code available for payment source | Admitting: Family Medicine

## 2021-11-07 ENCOUNTER — Other Ambulatory Visit (HOSPITAL_COMMUNITY): Payer: Self-pay

## 2021-11-07 DIAGNOSIS — B001 Herpesviral vesicular dermatitis: Secondary | ICD-10-CM | POA: Diagnosis not present

## 2021-11-07 MED ORDER — VALACYCLOVIR HCL 1 G PO TABS
2000.0000 mg | ORAL_TABLET | Freq: Two times a day (BID) | ORAL | 0 refills | Status: DC
  Filled 2021-11-07: qty 4, 1d supply, fill #0

## 2021-11-07 NOTE — Progress Notes (Signed)

## 2021-11-11 ENCOUNTER — Telehealth: Payer: No Typology Code available for payment source | Admitting: Nurse Practitioner

## 2021-12-08 ENCOUNTER — Other Ambulatory Visit (HOSPITAL_COMMUNITY): Payer: Self-pay

## 2021-12-17 ENCOUNTER — Other Ambulatory Visit: Payer: Self-pay

## 2021-12-17 ENCOUNTER — Inpatient Hospital Stay (HOSPITAL_COMMUNITY)
Admission: AD | Admit: 2021-12-17 | Discharge: 2021-12-17 | Disposition: A | Discharge status: Home / Self Care | Payer: No Typology Code available for payment source | Attending: Obstetrics & Gynecology | Admitting: Obstetrics & Gynecology

## 2021-12-17 DIAGNOSIS — Z3202 Encounter for pregnancy test, result negative: Secondary | ICD-10-CM | POA: Diagnosis not present

## 2021-12-17 DIAGNOSIS — K6289 Other specified diseases of anus and rectum: Secondary | ICD-10-CM | POA: Diagnosis not present

## 2021-12-17 DIAGNOSIS — N939 Abnormal uterine and vaginal bleeding, unspecified: Secondary | ICD-10-CM | POA: Diagnosis not present

## 2021-12-17 DIAGNOSIS — R102 Pelvic and perineal pain: Secondary | ICD-10-CM | POA: Insufficient documentation

## 2021-12-17 LAB — POCT PREGNANCY, URINE: Preg Test, Ur: NEGATIVE

## 2021-12-17 NOTE — MAU Note (Signed)
Kimberly Cooper is a 30 y.o. here in MAU reporting: started having lower abdominal and back cramping yesterday, states she also started bleeding yesterday. Thought it was her period. Pain comes in waves and medication is not helping.  LMP: 12/16/2021  Onset of complaint: yesterday  Pain score: 10/10  Vitals:   12/17/21 0837  BP: 130/63  Pulse: 92  Resp: 18  Temp: 98.9 F (37.2 C)  SpO2: 100%     Lab orders placed from triage: upt

## 2021-12-17 NOTE — MAU Provider Note (Signed)
Event Date/Time  First Provider Initiated Contact with Patient 12/17/21 [phone]     S Kimberly Cooper is a 30 y.o. patient who presents to MAU today with complaint of heavy vaginal bleeding. This is a new problem, onset yesterday 12/16/2021. Patient states her periods have never been heavy but she has saturated 2-3 pair of period underwear in about 4 hours. Patient states "something is wrong" and "I know I need blood work".  Patient also reports 10/10 abdominal pain and pressure in her rectum. Onset also yesterday. Patient describes "waves" of pain. She does not typically experience either symptom during her period. She has attempted management with medication but did not experience relief and declines to comment on which medication, time or dosage.  Patient does not believe she is pregnant and has not taken a home pregnancy test. She endorses history of "false negative" pregnancy tests.  O BP 130/63 (BP Location: Right Arm)    Pulse 92    Temp 98.9 F (37.2 C) (Oral)    Resp 18    SpO2 100% Comment: room air   Physical Exam Vitals and nursing note reviewed. Exam conducted with a chaperone present.  Constitutional:      Appearance: She is well-developed.  Cardiovascular:     Rate and Rhythm: Normal rate.  Pulmonary:     Effort: Pulmonary effort is normal.  Neurological:     Mental Status: She is alert and oriented to person, place, and time.  Psychiatric:        Mood and Affect: Mood normal.        Behavior: Behavior normal.   A Medical screening exam complete Negative urine pregnancy test in MAU Vital signs stable Encouraged patient to transfer to [facility] for further evaluation  P Discharge from MAU in stable condition Patient declines transfer to [facility], CNM 12/17/2021 9:20 AM

## 2021-12-18 ENCOUNTER — Ambulatory Visit (INDEPENDENT_AMBULATORY_CARE_PROVIDER_SITE_OTHER): Payer: No Typology Code available for payment source | Admitting: Nurse Practitioner

## 2021-12-18 ENCOUNTER — Other Ambulatory Visit (HOSPITAL_COMMUNITY): Payer: Self-pay

## 2021-12-18 VITALS — BP 128/70 | HR 86 | Temp 98.1°F | Ht 61.4 in | Wt 171.6 lb

## 2021-12-18 DIAGNOSIS — E6609 Other obesity due to excess calories: Secondary | ICD-10-CM | POA: Diagnosis not present

## 2021-12-18 DIAGNOSIS — N946 Dysmenorrhea, unspecified: Secondary | ICD-10-CM | POA: Diagnosis not present

## 2021-12-18 DIAGNOSIS — Z6832 Body mass index (BMI) 32.0-32.9, adult: Secondary | ICD-10-CM

## 2021-12-18 MED ORDER — NAPROXEN 500 MG PO TABS
500.0000 mg | ORAL_TABLET | Freq: Two times a day (BID) | ORAL | 0 refills | Status: DC
  Filled 2021-12-18: qty 10, 5d supply, fill #0

## 2021-12-18 NOTE — Progress Notes (Signed)
I,Tianna Badgett,acting as a Education administrator for [employer], NP.,have documented all relevant documentation on the behalf of [employer], NP,as directed by  Bary Castilla, NP while in the presence of Bary Castilla, NP.  This visit occurred during the SARS-CoV-2 public health emergency.  Safety protocols were in place, including screening questions prior to the visit, additional usage of staff PPE, and extensive cleaning of exam room while observing appropriate contact time as indicated for disinfecting solutions.  Subjective:     Patient ID: Kimberly Cooper , female    DOB: Feb 07, 1991 , 30 y.o.   MRN: [id number]   Chief Complaint  Patient presents with   Abdominal Cramping    HPI  Patient presents with abdominal cramps. Patient stated that her period began 12/16/21.  She went to [facility] hospital. Sexually active.  Last time she had intercourse was on 12/25. She admits using 2-3 pads in 3 hours. She does not have a OBGYN. Denies shortness of breath or chest pain. Pain mostly in her lower abdomen radiating to her left back.    Abdominal Cramping Pertinent negatives include no diarrhea, fever, headaches or nausea.    Past Medical History:  Diagnosis Date   Allergy    Chlamydia    GERD (gastroesophageal reflux disease)    Gonorrhea    MRSA infection greater than 3 months ago 2003   ABDOMINAL CYST     Family History  Problem Relation Age of Onset   Hypertension Mother    Heart disease Father 60       MI   Other Father        gunshot, paralysis   Breast cancer Maternal Aunt    Breast cancer Paternal Aunt    Cancer Paternal Aunt 47       stomach cancer   Breast cancer Maternal Grandmother    Stroke Maternal Grandmother    Cancer Maternal Grandfather        lung/prostate     Current Outpatient Medications:    EPINEPHrine 0.3 mg/0.3 mL IJ SOAJ injection, Inject 0.3 mg into the muscle as needed for anaphylaxis., Disp: 2 each, Rfl: 0   Fluocinonide 0.1 % CREA,  APPLY A SMALL AMOUNT TO AFFECTED AREA TWICE A DAY FOR 2 WEEKS ON THEN 1 WEEK OFF, Disp: 60 g, Rfl: 2   hydroquinone 4 % cream, Apply a small amount to affected area twice a day as needed until desired color is achieved, Disp: 56.7 g, Rfl: 1   hydrOXYzine (ATARAX/VISTARIL) 10 MG tablet, Take 1-3 tablets (10-30 mg total) by mouth at bedtime as needed for itching, Disp: 30 tablet, Rfl: 0   levocetirizine (XYZAL) 5 MG tablet, Take 1 tablet (5 mg total) by mouth every evening for 3 weeks, Disp: 30 tablet, Rfl: 0   naproxen (NAPROSYN) 500 MG tablet, Take 1 tablet (500 mg total) by mouth 2 (two) times daily with a meal as needed for pain, Disp: 10 tablet, Rfl: 0   tretinoin (RETIN-A) 0.1 % cream, Apply to face every evening as tolerated, Disp: 20 g, Rfl: 1   tretinoin (RETIN-A) 0.1 % cream, Apply pea size amount to face nightly followed by moisturizer, Disp: 45 g, Rfl: 2   triamcinolone-CeraVe, Apply 1 application topically 2 (two) times daily to affected areas as needed., Disp: 454 g, Rfl: 1   valACYclovir (VALTREX) 1000 MG tablet, Take 2 tablets (2,000 mg total) by mouth 2 (two) times daily., Disp: 4 tablet, Rfl: 0   Vitamin D, Ergocalciferol, (DRISDOL) 1.25 MG (  50000 UNIT) CAPS capsule, Take 1 capsule by mouth on Tuesday and 1 capsule on Fridays., Disp: 24 capsule, Rfl: 0   Allergies  Allergen Reactions   Other Hives    All Raw Nuts: Swelling   Penicillins Hives     Review of Systems  Constitutional: Negative.  Negative for chills and fever.  HENT:  Negative for congestion.   Respiratory: Negative.  Negative for cough, shortness of breath and wheezing.   Cardiovascular: Negative.  Negative for chest pain and palpitations.  Gastrointestinal:  Positive for abdominal pain. Negative for diarrhea and nausea.  Endocrine: Negative for polydipsia, polyphagia and polyuria.  Genitourinary:  Positive for menstrual problem.  Musculoskeletal:  Positive for back pain.  Neurological: Negative.  Negative for  dizziness, weakness and headaches.    Today's Vitals   12/18/21 0910  BP: 128/70  Pulse: 86  Temp: 98.1 F (36.7 C)  TempSrc: Oral  Weight: 171 lb 9.6 oz (77.8 kg)  Height: 5' 1.4" (1.56 m)   Body mass index is 32 kg/m.  Wt Readings from Last 3 Encounters:  12/18/21 171 lb 9.6 oz (77.8 kg)  06/10/21 175 lb 12.8 oz (79.7 kg)  05/08/20 164 lb (74.4 kg)    Objective:  Physical Exam Constitutional:      Appearance: Normal appearance.  HENT:     Head: Normocephalic and atraumatic.  Cardiovascular:     Rate and Rhythm: Normal rate and regular rhythm.     Pulses: Normal pulses.     Heart sounds: Normal heart sounds. No murmur heard. Pulmonary:     Effort: Pulmonary effort is normal. No respiratory distress.     Breath sounds: Normal breath sounds. No wheezing.  Skin:    General: Skin is warm and dry.     Capillary Refill: Capillary refill takes less than 2 seconds.  Neurological:     Mental Status: She is alert and oriented to person, place, and time.  Psychiatric:        Mood and Affect: Mood normal.        Behavior: Behavior normal.        Assessment And Plan:     1. Dysmenorrhea - CBC no Diff - TSH + free T4 - Ambulatory referral to Obstetrics / Gynecology - Pregnancy Test, Serum, Qual -Advised patient if she continues to have heavy menstrual cycle, to go to the emergency room.  -Advised patient to follow up with OBGYN.   2. Menstrual pain - naproxen (NAPROSYN) 500 MG tablet; Take 1 tablet (500 mg total) by mouth 2 (two) times daily with a meal as needed for pain  Dispense: 10 tablet; Refill: 0 -Advised patient to use heating pad as needed.    3. Class 1 obesity due to excess calories without serious comorbidity with body mass index (BMI) of 32.0 to 32.9 in adult Advised patient on a healthy diet including avoiding fast food and red meats. Increase the intake of lean meats including grilled chicken and [place].  Drink a lot of water. Decrease intake of fatty  foods. Exercise for 30-45 min. 4-5 a week to decrease the risk of cardiac event.   The patient was encouraged to call or send a message through [employer] for any questions or concerns.   Follow up: if symptoms persist or do not get better.   Side effects and appropriate use of all the medication(s) were discussed with the patient today. Patient advised to use the medication(s) as directed by their healthcare provider. The patient was  encouraged to read, review, and understand all associated package inserts and contact our office with any questions or concerns. The patient accepts the risks of the treatment plan and had an opportunity to ask questions.   Staying healthy and adopting a healthy lifestyle for your overall health is important. You should eat 7 or more servings of fruits and vegetables per day. You should drink plenty of water to keep yourself hydrated and your kidneys healthy. This includes about 65-80+ fluid ounces of water. Limit your intake of animal fats especially for elevated cholesterol. Avoid highly processed food and limit your salt intake if you have hypertension. Avoid foods high in saturated/Trans fats. Along with a healthy diet it is also very important to maintain time for yourself to maintain a healthy mental health with low stress levels. You should get atleast 150 min of moderate intensity exercise weekly for a healthy heart. Along with eating right and exercising, aim for at least 7-9 hours of sleep daily.  Eat more whole grains which includes barley, wheat berries, oats, brown rice and whole wheat pasta. Use healthy plant oils which include olive, soy, corn, sunflower and peanut. Limit your caffeine and sugary drinks. Limit your intake of fast foods. Limit milk and dairy products to one or two daily servings.   Patient was given opportunity to ask questions. Patient verbalized understanding of the plan and was able to repeat key elements of the plan. All questions were answered  to their satisfaction.  Raman Jerusalem Wert, DNP   I, Raman [employer] [place] have reviewed all documentation for this visit. The documentation on 12/18/21 for the exam, diagnosis, procedures, and orders are all accurate and complete.     IF YOU HAVE BEEN REFERRED TO A SPECIALIST, IT MAY TAKE 1-2 WEEKS TO SCHEDULE/PROCESS THE REFERRAL. IF YOU HAVE NOT HEARD FROM US/SPECIALIST IN TWO WEEKS, PLEASE GIVE [place] A CALL AT [phone].   THE PATIENT IS ENCOURAGED TO PRACTICE SOCIAL DISTANCING DUE TO THE COVID-19 PANDEMIC.

## 2021-12-18 NOTE — Patient Instructions (Signed)
Dysmenorrhea Dysmenorrhea means cramps during your period (menstrual period) that cause pain in your lower belly (abdomen). The pain is caused by the tightening (contracting) of the muscles of the womb (uterus). The pain may be mild or very bad. Primary dysmenorrhea is cramps that last a couple of days when a woman starts having periods or soon after. As a woman gets older or has a baby, the cramps will usually lessen or disappear. Secondary dysmenorrhea begins later in life and is caused by other problems. What are the causes? This condition may be caused by problems with the: Tissue that lines the womb. This tissue may grow: Outside of the womb. Into the walls of the womb. Blood vessels in the area between your hip bones (pelvis). Tissue in the lower part of the womb (cervix), including growths (polyps). Muscles that hold up the womb. Bladder. Bowels. It can also be caused by cancer. Other causes include: A very tipped womb. The lower part of the womb having a small opening. Tumors in the womb that are not cancer. Pelvic inflammatory disease (PID). Scars from surgeries you have had. A cyst in the ovaries. An IUD (intrauterine device). What increases the risk? Being younger than age 39. Having started puberty early. Having irregular bleeding or heavy bleeding. Never having given birth. Having a family history of period cramps. Smoking or using products with nicotine. Having a high body weight or a low body weight. What are the signs or symptoms? Cramps and pain in the lower belly or lower back. A feeling of fullness in the lower belly. Periods lasting for longer than 7 days. Headaches. Bloating. Tiredness (fatigue). Feeling like you may vomit (nauseous) or vomiting. Watery poop (diarrhea) or loose poop (stool). Sweating. Dizziness. How is this treated? Treatment depends on the cause of the cramps. Treatment may include medicines, such as: Medicines for pain. Medicines for  bleeding. Body chemical (hormone) replacement therapy. Shots (injections) to stop the menstrual period. Birth control pills. An IUD. NSAIDs, such as ibuprofen. Other treatments may include: Surgeries. Procedures. Nerve stimulation. Doing exercises. Yoga and alternative treatments. Work with your doctor to find what treatments are best for you. Follow these instructions at home: Helping pain and cramping  If told, put heat on your lower back or belly when you have pain or cramps. Do this as often as told by your doctor. Use the heat source that your doctor recommends, such as a moist heat pack or a heating pad. Place a towel between your skin and the heat. Leave the heat on for 20-30 minutes. Take off the heat if your skin turns bright red. This is very important. If you cannot feel pain, heat, or cold, you have a greater risk of getting burned. Do not sleep with a heating pad. Exercise. Walking, swimming, or biking can help take away cramps. Massage your lower back or belly. This may help lessen pain. General instructions Take over-the-counter and prescription medicines only as told by your doctor. Ask your doctor if you should avoid driving or using machines while you are taking your medicine. Avoid alcohol and caffeine during and right before your period. These can make cramps worse. Do not smoke or use any products that contain nicotine or tobacco. If you need help quitting, ask your doctor. Keep all follow-up visits. Contact a doctor if: You have pain that gets worse. You have pain that does not get better with medicine. You have pain during sex. You feel like you may vomit or you vomit  during your period and medicine does not help. Get help right away if: You faint. Summary Dysmenorrhea means painful cramps during your period. Put heat on your lower back or belly when you have pain or cramps. Do exercises like walking, swimming, or biking to help with cramps. Contact a  doctor if you have pain during sex. This information is not intended to replace advice given to you by your health care provider. Make sure you discuss any questions you have with your health care provider. Document Revised: 07/24/2020 Document Reviewed: 07/24/2020 Elsevier Patient Education  2022 [employer].

## 2021-12-19 LAB — HCG, SERUM, QUALITATIVE: hCG,Beta Subunit,Qual,Serum: NEGATIVE m[IU]/mL (ref <6)

## 2021-12-19 LAB — CBC
Hematocrit: 36.1 % (ref 34.0–46.6)
Hemoglobin: 12.3 g/dL (ref 11.1–15.9)
MCH: 30.2 pg (ref 26.6–33.0)
MCHC: 34.1 g/dL (ref 31.5–35.7)
MCV: 89 fL (ref 79–97)
Platelets: 217 10*3/uL (ref 150–450)
RBC: 4.07 x10E6/uL (ref 3.77–5.28)
RDW: 11.9 % (ref 11.7–15.4)
WBC: 5.8 10*3/uL (ref 3.4–10.8)

## 2021-12-19 LAB — TSH+FREE T4
Free T4: 0.98 ng/dL (ref 0.82–1.77)
TSH: 1.7 u[IU]/mL (ref 0.450–4.500)

## 2021-12-22 ENCOUNTER — Other Ambulatory Visit (HOSPITAL_COMMUNITY): Payer: Self-pay

## 2022-01-12 ENCOUNTER — Other Ambulatory Visit (HOSPITAL_COMMUNITY): Payer: Self-pay

## 2022-01-12 MED ORDER — TRANEXAMIC ACID 650 MG PO TABS
1300.0000 mg | ORAL_TABLET | Freq: Three times a day (TID) | ORAL | 1 refills | Status: DC | PRN
  Filled 2022-01-12: qty 30, 5d supply, fill #0

## 2022-02-02 ENCOUNTER — Other Ambulatory Visit: Payer: Self-pay | Admitting: Endocrinology

## 2022-02-02 DIAGNOSIS — E221 Hyperprolactinemia: Secondary | ICD-10-CM

## 2022-02-10 ENCOUNTER — Other Ambulatory Visit (HOSPITAL_COMMUNITY): Payer: Self-pay

## 2022-02-10 MED ORDER — ALPRAZOLAM 0.5 MG PO TABS
0.5000 mg | ORAL_TABLET | ORAL | 0 refills | Status: DC
  Filled 2022-02-10: qty 2, 1d supply, fill #0

## 2022-02-10 MED ORDER — MYFEMBREE 40-1-0.5 MG PO TABS
1.0000 | ORAL_TABLET | Freq: Every day | ORAL | 1 refills | Status: DC
  Filled 2022-02-10 – 2022-02-16 (×2): qty 28, 28d supply, fill #0
  Filled 2022-04-08: qty 28, 28d supply, fill #1

## 2022-02-11 ENCOUNTER — Ambulatory Visit
Admission: RE | Admit: 2022-02-11 | Discharge: 2022-02-11 | Disposition: A | Discharge status: Home / Self Care | Payer: No Typology Code available for payment source | Source: Ambulatory Visit | Attending: Endocrinology | Admitting: Endocrinology

## 2022-02-11 ENCOUNTER — Other Ambulatory Visit: Payer: Self-pay

## 2022-02-11 DIAGNOSIS — E221 Hyperprolactinemia: Secondary | ICD-10-CM

## 2022-02-11 MED ORDER — GADOBENATE DIMEGLUMINE 529 MG/ML IV SOLN
7.0000 mL | Freq: Once | INTRAVENOUS | Status: AC | PRN
  Administered 2022-02-11: 7 mL via INTRAVENOUS

## 2022-02-11 MED ORDER — GADOBENATE DIMEGLUMINE 529 MG/ML IV SOLN: 7.0000 mL | Freq: Once | INTRAVENOUS | Status: DC | PRN

## 2022-02-13 ENCOUNTER — Other Ambulatory Visit (HOSPITAL_COMMUNITY): Payer: Self-pay

## 2022-02-13 MED ORDER — CABERGOLINE 0.5 MG PO TABS
ORAL_TABLET | ORAL | 2 refills | Status: DC
Start: 2022-02-13 — End: 2023-09-13
  Filled 2022-02-13: qty 8, 28d supply, fill #0
  Filled 2022-03-13: qty 8, 28d supply, fill #1

## 2022-02-16 ENCOUNTER — Other Ambulatory Visit (HOSPITAL_COMMUNITY): Payer: Self-pay

## 2022-02-17 ENCOUNTER — Other Ambulatory Visit (HOSPITAL_COMMUNITY): Payer: Self-pay

## 2022-02-18 ENCOUNTER — Other Ambulatory Visit (HOSPITAL_COMMUNITY): Payer: Self-pay

## 2022-03-02 ENCOUNTER — Other Ambulatory Visit (HOSPITAL_COMMUNITY): Payer: Self-pay

## 2022-03-02 MED ORDER — DAPSONE 5 % EX GEL
1.0000 | Freq: Every morning | CUTANEOUS | 2 refills | Status: DC
  Filled 2022-03-02: qty 60, 30d supply, fill #0

## 2022-03-04 ENCOUNTER — Other Ambulatory Visit (HOSPITAL_COMMUNITY): Payer: Self-pay

## 2022-03-07 ENCOUNTER — Other Ambulatory Visit: Payer: Self-pay

## 2022-03-09 ENCOUNTER — Other Ambulatory Visit (HOSPITAL_COMMUNITY): Payer: Self-pay

## 2022-03-09 ENCOUNTER — Other Ambulatory Visit: Payer: Self-pay

## 2022-03-10 ENCOUNTER — Other Ambulatory Visit (HOSPITAL_COMMUNITY): Payer: Self-pay

## 2022-03-12 ENCOUNTER — Other Ambulatory Visit (HOSPITAL_COMMUNITY): Payer: Self-pay

## 2022-03-13 ENCOUNTER — Other Ambulatory Visit: Payer: Self-pay

## 2022-03-13 ENCOUNTER — Other Ambulatory Visit (HOSPITAL_COMMUNITY): Payer: Self-pay

## 2022-03-16 ENCOUNTER — Other Ambulatory Visit (HOSPITAL_COMMUNITY): Payer: Self-pay

## 2022-03-17 ENCOUNTER — Other Ambulatory Visit (HOSPITAL_COMMUNITY): Payer: Self-pay

## 2022-03-30 ENCOUNTER — Other Ambulatory Visit (HOSPITAL_COMMUNITY): Payer: Self-pay

## 2022-03-30 ENCOUNTER — Telehealth: Payer: No Typology Code available for payment source | Admitting: Physician Assistant

## 2022-03-30 DIAGNOSIS — J069 Acute upper respiratory infection, unspecified: Secondary | ICD-10-CM

## 2022-03-30 MED ORDER — BENZONATATE 100 MG PO CAPS
100.0000 mg | ORAL_CAPSULE | Freq: Three times a day (TID) | ORAL | 0 refills | Status: DC | PRN
  Filled 2022-03-30: qty 30, 10d supply, fill #0

## 2022-03-30 MED ORDER — PSEUDOEPH-BROMPHEN-DM 30-2-10 MG/5ML PO SYRP
5.0000 mL | ORAL_SOLUTION | Freq: Four times a day (QID) | ORAL | 0 refills | Status: DC | PRN
  Filled 2022-03-30: qty 120, 6d supply, fill #0

## 2022-03-30 MED ORDER — IPRATROPIUM BROMIDE 0.03 % NA SOLN
2.0000 | Freq: Two times a day (BID) | NASAL | 0 refills | Status: DC
  Filled 2022-03-30: qty 30, 43d supply, fill #0

## 2022-03-30 NOTE — Progress Notes (Signed)
E-Visit for Upper Respiratory Infection  ? ?We are sorry you are not feeling well.  Here is how we plan to help! ? ?Based on what you have shared with me, it looks like you may have a viral upper respiratory infection.  Upper respiratory infections are caused by a large number of viruses; however, rhinovirus is the most common cause.  ? ?Symptoms vary from person to person, with common symptoms including sore throat, cough, fatigue or lack of energy and feeling of general discomfort.  A low-grade fever of up to 100.4 may present, but is often uncommon.  Symptoms vary however, and are closely related to a person's age or underlying illnesses.  The most common symptoms associated with an upper respiratory infection are nasal discharge or congestion, cough, sneezing, headache and pressure in the ears and face.  These symptoms usually persist for about 3 to 10 days, but can last up to 2 weeks.  It is important to know that upper respiratory infections do not cause serious illness or complications in most cases.   ? ?Upper respiratory infections can be transmitted from person to person, with the most common method of transmission being a person's hands.  The virus is able to live on the skin and can infect other persons for up to 2 hours after direct contact.  Also, these can be transmitted when someone coughs or sneezes; thus, it is important to cover the mouth to reduce this risk.  To keep the spread of the illness at [place], good hand hygiene is very important. ? ?This is an infection that is most likely caused by a virus. There are no specific treatments other than to help you with the symptoms until the infection runs its course.  We are sorry you are not feeling well.  Here is how we plan to help! ? ? ?For nasal congestion, you may use an oral decongestants such as Mucinex D or if you have glaucoma or high blood pressure use plain Mucinex.  Saline nasal spray or nasal drops can help and can safely be used as often as  needed for congestion.  For your congestion, I have prescribed Ipratropium Bromide nasal spray 0.03% two sprays in each nostril 2-3 times a day ? ?If you do not have a history of heart disease, hypertension, diabetes or thyroid disease, prostate/bladder issues or glaucoma, you may also use Sudafed to treat nasal congestion.  It is highly recommended that you consult with a pharmacist or your primary care physician to ensure this medication is safe for you to take.    ? ?If you have a cough, you may use cough suppressants such as Delsym and Robitussin.  If you have glaucoma or high blood pressure, you can also use Coricidin HBP.   ?For cough I have prescribed for you A prescription cough medication called Tessalon Perles 100 mg. You may take 1-2 capsules every 8 hours as needed for cough and Bromfed DM take 91m every 6 hours as needed for cough. Can take these 2 together as needed.  ? ?If you have a sore or scratchy throat, use a saltwater gargle- ? to ? teaspoon of salt dissolved in a 4-ounce to 8-ounce glass of warm water.  Gargle the solution for approximately 15-30 seconds and then spit.  It is important not to swallow the solution.  You can also use throat lozenges/cough drops and Chloraseptic spray to help with throat pain or discomfort.  Warm or cold liquids can also be helpful in  relieving throat pain. ? ?For headache, pain or general discomfort, you can use Ibuprofen or Tylenol as directed.   ?Some authorities believe that zinc sprays or the use of Echinacea may shorten the course of your symptoms. ? ? ?HOME CARE ?Only take medications as instructed by your medical team. ?Be sure to drink plenty of fluids. Water is fine as well as fruit juices, sodas and electrolyte beverages. You may want to stay away from caffeine or alcohol. If you are nauseated, try taking small sips of liquids. How do you know if you are getting enough fluid? Your urine should be a pale yellow or almost colorless. ?Get rest. ?Taking a  steamy shower or using a humidifier may help nasal congestion and ease sore throat pain. You can place a towel over your head and breathe in the steam from hot water coming from a faucet. ?Using a saline nasal spray works much the same way. ?Cough drops, hard candies and sore throat lozenges may ease your cough. ?Avoid close contacts especially the very young and the elderly ?Cover your mouth if you cough or sneeze ?Always remember to wash your hands.  ? ?GET HELP RIGHT AWAY IF: ?You develop worsening fever. ?If your symptoms do not improve within 10 days ?You develop yellow or green discharge from your nose over 3 days. ?You have coughing fits ?You develop a severe head ache or visual changes. ?You develop shortness of breath, difficulty breathing or start having chest pain ?Your symptoms persist after you have completed your treatment plan ? ?MAKE SURE YOU  ?Understand these instructions. ?Will watch your condition. ?Will get help right away if you are not doing well or get worse. ? ?Thank you for choosing an e-visit. ? ?Your e-visit answers were reviewed by a board certified advanced clinical practitioner to complete your personal care plan. Depending upon the condition, your plan could have included both over the counter or prescription medications. ? ?Please review your pharmacy choice. Make sure the pharmacy is open so you can pick up prescription now. If there is a problem, you may contact your provider through [employer] and have the prescription routed to another pharmacy.  Your safety is important to [place]. If you have drug allergies check your prescription carefully.  ? ?For the next 24 hours you can use MyChart to ask questions about today's visit, request a non-urgent call back, or ask for a work or school excuse. ?You will get an email in the next two days asking about your experience. I hope that your e-visit has been valuable and will speed your recovery. ? ? ?I provided 5 minutes of non  face-to-face time during this encounter for chart review and documentation.  ? ?

## 2022-04-01 ENCOUNTER — Other Ambulatory Visit (HOSPITAL_COMMUNITY): Payer: Self-pay

## 2022-04-01 MED ORDER — NORETHINDRONE ACETATE 5 MG PO TABS
5.0000 mg | ORAL_TABLET | Freq: Every day | ORAL | 1 refills | Status: DC
  Filled 2022-04-01: qty 41, 41d supply, fill #0

## 2022-04-03 ENCOUNTER — Encounter: Payer: Self-pay | Admitting: Nurse Practitioner

## 2022-04-09 ENCOUNTER — Other Ambulatory Visit (HOSPITAL_COMMUNITY): Payer: Self-pay

## 2022-04-22 ENCOUNTER — Encounter: Payer: Self-pay | Admitting: Nurse Practitioner

## 2022-04-22 ENCOUNTER — Other Ambulatory Visit (HOSPITAL_COMMUNITY)
Admission: RE | Admit: 2022-04-22 | Discharge: 2022-04-22 | Disposition: A | Discharge status: Home / Self Care | Payer: No Typology Code available for payment source | Source: Ambulatory Visit | Attending: Nurse Practitioner | Admitting: Nurse Practitioner

## 2022-04-22 ENCOUNTER — Ambulatory Visit (INDEPENDENT_AMBULATORY_CARE_PROVIDER_SITE_OTHER): Payer: No Typology Code available for payment source | Admitting: Nurse Practitioner

## 2022-04-22 VITALS — BP 110/60 | HR 82 | Temp 98.5°F | Ht 61.4 in | Wt 176.0 lb

## 2022-04-22 DIAGNOSIS — Z113 Encounter for screening for infections with a predominantly sexual mode of transmission: Secondary | ICD-10-CM | POA: Insufficient documentation

## 2022-04-22 DIAGNOSIS — E6609 Other obesity due to excess calories: Secondary | ICD-10-CM | POA: Diagnosis not present

## 2022-04-22 DIAGNOSIS — Z6832 Body mass index (BMI) 32.0-32.9, adult: Secondary | ICD-10-CM | POA: Diagnosis not present

## 2022-04-22 NOTE — Patient Instructions (Signed)
Preventing Sexually Transmitted Infections, Adult Sexually transmitted infections (STIs) are spread from person to person (are contagious). They are spread, or transmitted, through bodily fluids exchanged during sex or sexual contact. These bodily fluids include saliva, semen, blood, vaginal mucus, and urine. STIs are very common and can occur among people of all ages. Some common STIs include: Herpes. Hepatitis B. Chlamydia. Gonorrhea. Syphilis. HPV (human papillomavirus). HIV, also called the human immunodeficiency virus. This is the virus that can cause AIDS (acquired immunodeficiency syndrome). Often, people who have these STIs do not have symptoms. Even without symptoms, these infections can be spread from person to person and require treatment. How can these conditions affect me? STIs can be treated, and many STIs can be cured. However, some STIs cannot be cured and will affect you for the rest of your life. Certain STIs may: Require you to take medicine for the rest of your life. Affect your ability to have children (your fertility). Increase your risk for developing another STI or certain serious health conditions. These may include: Cervical cancer. Head and neck cancer. Pelvic inflammatory disease (PID), in women. Organ damage or damage to other parts of your body, if the infection spreads. Cause problems during pregnancy and may be transmitted to the baby during the pregnancy or childbirth. What can increase my risk? You may have an increased risk for developing an STI if: You have unprotected sex or sexual contact. You have more than one sex partner. You have a sex partner who has multiple sex partners. You have sexual contact with someone who has an STI. You have an STI or you had an STI before. You inject drugs or have a sex partner or partners who inject drugs. What actions can I take to prevent STIs? The only way to completely prevent STIs is not to have sex of any  kind. This is called practicing abstinence. If you are sexually active, you can protect yourself and others by taking these actions to lower your risk of getting an STI: Lifestyle Avoid mixing alcohol, drugs, and sex. Alcohol and drug use can affect your ability to make good decisions and can lead to risky sexual behaviors. Medicines Ask your health care provider about taking pre-exposure prophylaxis (PrEP) to prevent HIV infection. General information  Stay up to date on vaccinations. Certain vaccines can lower your risk of getting certain STIs, such as: Hepatitis A and hepatitis B vaccines. HPV (human papillomavirus) vaccine. Have only one sex partner (be monogamous) or limit the number of sex partners you have. Use methods that prevent the exchange of body fluids between partners (barrier protection) correctly every time you have sexual contact. Barrier protection can be used during oral, vaginal, or anal sex. Commonly used barrier methods include: External (female) condom. Internal (female) condom. Dental dam. Use a new barrier method for every sex act from start to finish. Get tested for STIs. Have your partners get tested, too. If you test positive for an STI, follow recommendations from your health care provider about treatment and make sure your sex partners are tested and treated. Birth control pills, injections, implants, and intrauterine devices (IUDs) do not protect against STIs. To prevent both STIs and pregnancy, always use a condom with another form of birth control. Some STIs, such as herpes, are spread through skin-to-skin contact. A barrier method may not protect you from getting such STIs. Avoid all sexual contact if you or your partners have herpes and there is an active flare with open sores. Where to   find more information Learn more about STIs from: Centers for Disease Control and Prevention: More information about specific STIs: cdc.gov/std Places to get sexual health  counseling and treatment for free or at a low cost: gettested.cdc.gov U.S. Department of Health and Human Services: www.womenshealth.gov Summary Sexually transmitted infections (STIs) can spread through exchanging bodily fluids during sexual contact. Fluids include saliva, semen, blood, vaginal mucus, and urine. You may have an increased risk for developing an STI if you have unprotected sex. If you do have sex, limit your number of sex partners and use barrier protection every time you have sex. This information is not intended to replace advice given to you by your health care provider. Make sure you discuss any questions you have with your health care provider. Document Revised: 12/01/2021 Document Reviewed: 01/22/2020 Elsevier Patient Education  2023 [employer] Inc.  

## 2022-04-22 NOTE — Progress Notes (Signed)
?Industrial/product designer as a Education administrator for [employer], FNP.,have documented all relevant documentation on the behalf of Minette Brine, FNP,as directed by  Minette Brine, FNP while in the presence of Minette Brine, [place]. ? ?This visit occurred during the SARS-CoV-2 public health emergency.  Safety protocols were in place, including screening questions prior to the visit, additional usage of staff PPE, and extensive cleaning of exam room while observing appropriate contact time as indicated for disinfecting solutions. ? ?Subjective:  ?  ? Patient ID: Kimberly Cooper , female    DOB: August 21, 1991 , 31 y.o.   MRN: [id number] ? ? ?No chief complaint on file. ? ? ?HPI ? ?Patient presents today for STI testing.  She is not having any symptoms and no known positive exposure.  ?  ? ?Past Medical History:  ?Diagnosis Date  ? Allergy   ? Chlamydia   ? GERD (gastroesophageal reflux disease)   ? Gonorrhea   ? MRSA infection greater than 3 months ago 2003  ? ABDOMINAL CYST  ?  ? ?Family History  ?Problem Relation Age of Onset  ? Hypertension Mother   ? Heart disease Father 72  ?     MI  ? Other Father   ?     gunshot, paralysis  ? Breast cancer Maternal Aunt   ? Breast cancer Paternal Aunt   ? Cancer Paternal Aunt 42  ?     stomach cancer  ? Breast cancer Maternal Grandmother   ? Stroke Maternal Grandmother   ? Cancer Maternal Grandfather   ?     lung/prostate  ? ? ? ?Current Outpatient Medications:  ?  ALPRAZolam (XANAX) 0.5 MG tablet, Take 1 tablet (0.5 mg total) by mouth 30 minutes before MRI, Disp: 2 tablet, Rfl: 0 ?  benzonatate (TESSALON) 100 MG capsule, Take 1 capsule (100 mg total) by mouth 3 (three) times daily as needed., Disp: 30 capsule, Rfl: 0 ?  brompheniramine-pseudoephedrine-DM 30-2-10 MG/5ML syrup, Take 5 mLs by mouth 4 (four) times daily as needed., Disp: 120 mL, Rfl: 0 ?  cabergoline (DOSTINEX) 0.5 MG tablet, Take 1 tablet by mouth saturday & wednesday, Disp: 8 tablet, Rfl: 2 ?  Dapsone 5 % topical gel, Apply 1  application topically to the face every morning., Disp: 60 g, Rfl: 2 ?  EPINEPHrine 0.3 mg/0.3 mL IJ SOAJ injection, Inject 0.3 mg into the muscle as needed for anaphylaxis., Disp: 2 each, Rfl: 0 ?  Fluocinonide 0.1 % CREA, APPLY A SMALL AMOUNT TO AFFECTED AREA TWICE A DAY FOR 2 WEEKS ON THEN 1 WEEK OFF, Disp: 60 g, Rfl: 2 ?  hydroquinone 4 % cream, Apply a small amount to affected area twice a day as needed until desired color is achieved, Disp: 56.7 g, Rfl: 1 ?  hydrOXYzine (ATARAX/VISTARIL) 10 MG tablet, Take 1-3 tablets (10-30 mg total) by mouth at bedtime as needed for itching, Disp: 30 tablet, Rfl: 0 ?  ipratropium (ATROVENT) 0.03 % nasal spray, Place 2 sprays into both nostrils every 12 (twelve) hours., Disp: 30 mL, Rfl: 0 ?  levocetirizine (XYZAL) 5 MG tablet, Take 1 tablet (5 mg total) by mouth every evening for 3 weeks, Disp: 30 tablet, Rfl: 0 ?  naproxen (NAPROSYN) 500 MG tablet, Take 1 tablet (500 mg total) by mouth 2 (two) times daily with a meal as needed for pain, Disp: 10 tablet, Rfl: 0 ?  norethindrone (AYGESTIN) 5 MG tablet, Take 1 tablet (5 mg total) by mouth daily., Disp: 41 tablet, Rfl:  1 ?  Relugolix-Estradiol-Norethind (MYFEMBREE) 40-1-0.5 MG TABS, Take 1 tablet by mouth daily., Disp: 28 tablet, Rfl: 1 ?  tranexamic acid (LYSTEDA) 650 MG TABS tablet, Take 2 tablets (1,300 mg total) by mouth every 8 (eight) hours as needed for 5 days., Disp: 40 tablet, Rfl: 1 ?  tretinoin (RETIN-A) 0.1 % cream, Apply to face every evening as tolerated, Disp: 20 g, Rfl: 1 ?  tretinoin (RETIN-A) 0.1 % cream, Apply pea size amount to face nightly followed by moisturizer, Disp: 45 g, Rfl: 2 ?  triamcinolone-CeraVe, Apply 1 application topically 2 (two) times daily to affected areas as needed., Disp: 454 g, Rfl: 1 ?  Vitamin D, Ergocalciferol, (DRISDOL) 1.25 MG (50000 UNIT) CAPS capsule, Take 1 capsule by mouth on Tuesday and 1 capsule on Fridays., Disp: 24 capsule, Rfl: 0  ? ?Allergies  ?Allergen Reactions  ? Other  Hives  ?  All Raw Nuts: Swelling  ? Penicillins Hives  ?  ? ?Review of Systems  ?Constitutional: Negative.   ?Respiratory: Negative.    ?Cardiovascular: Negative.   ?Gastrointestinal: Negative.   ?Neurological: Negative.    ? ?Today's Vitals  ? 04/22/22 1208  ?BP: 110/60  ?Pulse: 82  ?Temp: 98.5 ?F (36.9 ?C)  ?TempSrc: Oral  ?Weight: 176 lb (79.8 kg)  ?Height: 5' 1.4" (1.56 m)  ? ?Body mass index is 32.82 kg/m?.  ?Wt Readings from Last 3 Encounters:  ?04/22/22 176 lb (79.8 kg)  ?12/18/21 171 lb 9.6 oz (77.8 kg)  ?06/10/21 175 lb 12.8 oz (79.7 kg)  ? ? ?Objective:  ?Physical Exam ?Constitutional:   ?   General: She is not in acute distress. ?   Appearance: Normal appearance. She is obese.  ?Pulmonary:  ?   Effort: Pulmonary effort is normal. No respiratory distress.  ?Neurological:  ?   General: No focal deficit present.  ?   Mental Status: She is alert and oriented to person, place, and time.  ?Psychiatric:     ?   Mood and Affect: Mood normal.     ?   Behavior: Behavior normal.     ?   Thought Content: Thought content normal.     ?   Judgment: Judgment normal.  ?  ? ?   ?Assessment And Plan:  ?   ?1. Class 1 obesity due to excess calories without serious comorbidity with body mass index (BMI) of 32.0 to 32.9 in adult ?She is encouraged to strive for BMI less than 30 to decrease cardiac risk. Advised to aim for at least 150 minutes of exercise per week. ? ?2. Screen for STD (sexually transmitted disease) ?- Hepatitis B surface antigen ?- Hepatitis C antibody ?- HIV Antibody (routine testing w rflx); Future ?- HSV(herpes simplex vrs) 1+2 ab-IgG ?- RPR ?- Urine cytology ancillary only ? ? ? ? ? ?Patient was given opportunity to ask questions. Patient verbalized understanding of the plan and was able to repeat key elements of the plan. All questions were answered to their satisfaction.  ?Minette Brine, FNP  ? ?I, Minette Brine, FNP, have reviewed all documentation for this visit. The documentation on 04/22/22 for the  exam, diagnosis, procedures, and orders are all accurate and complete.  ? ?IF YOU HAVE BEEN REFERRED TO A SPECIALIST, IT MAY TAKE 1-2 WEEKS TO SCHEDULE/PROCESS THE REFERRAL. IF YOU HAVE NOT HEARD FROM US/SPECIALIST IN TWO WEEKS, PLEASE GIVE [place] A CALL AT [phone] X [phone].  ? ?THE PATIENT IS ENCOURAGED TO PRACTICE SOCIAL DISTANCING DUE TO THE  COVID-19 PANDEMIC.   ?

## 2022-04-22 NOTE — Addendum Note (Signed)
Addended by: Roxine Caddy on: 04/22/2022 02:37 PM ? ? Modules accepted: Orders ? ?

## 2022-04-23 ENCOUNTER — Other Ambulatory Visit (HOSPITAL_COMMUNITY): Payer: Self-pay

## 2022-04-23 ENCOUNTER — Encounter: Payer: Self-pay | Admitting: Nurse Practitioner

## 2022-04-23 LAB — HSV(HERPES SIMPLEX VRS) I + II AB-IGG
HSV 1 Glycoprotein G Ab, IgG: <0.91 index (ref 0.00–0.90)
HSV 2 IgG, Type Spec: 11.2 index — ABNORMAL HIGH (ref 0.00–0.90)

## 2022-04-23 LAB — HIV ANTIBODY (ROUTINE TESTING W REFLEX): HIV Screen 4th Generation wRfx: NONREACTIVE

## 2022-04-23 LAB — RPR: RPR Ser Ql: NONREACTIVE

## 2022-04-23 LAB — HEPATITIS B SURFACE ANTIGEN: Hepatitis B Surface Ag: NEGATIVE

## 2022-04-23 LAB — HEPATITIS C ANTIBODY: Hep C Virus Ab: NONREACTIVE

## 2022-04-23 MED ORDER — CABERGOLINE 0.5 MG PO TABS
0.5000 mg | ORAL_TABLET | ORAL | 1 refills | Status: DC
  Filled 2022-04-23: qty 24, 84d supply, fill #0
  Filled 2022-06-23 – 2022-06-29 (×2): qty 24, 84d supply, fill #1

## 2022-04-24 LAB — URINE CYTOLOGY ANCILLARY ONLY
Bacterial Vaginitis-Urine: NEGATIVE
Candida Urine: NEGATIVE
Chlamydia: NEGATIVE
Comment: NEGATIVE
Comment: NEGATIVE
Comment: NORMAL
Neisseria Gonorrhea: NEGATIVE
Trichomonas: NEGATIVE

## 2022-04-30 ENCOUNTER — Telehealth: Payer: Self-pay | Admitting: Nurse Practitioner

## 2022-04-30 NOTE — Telephone Encounter (Signed)
Called patient to discuss positive HSV II results, we will retest in 2 weeks pending those labs will treat however she does not have any current symptoms.  Explained pathophysiology of HSV.  Answered all questions.  ?

## 2022-05-11 ENCOUNTER — Other Ambulatory Visit (HOSPITAL_COMMUNITY): Payer: Self-pay

## 2022-05-11 ENCOUNTER — Other Ambulatory Visit: Payer: No Typology Code available for payment source

## 2022-05-11 MED ORDER — MYFEMBREE 40-1-0.5 MG PO TABS
1.0000 | ORAL_TABLET | Freq: Every day | ORAL | 2 refills | Status: DC
  Filled 2022-05-11: qty 28, 28d supply, fill #0
  Filled 2022-06-05: qty 28, 28d supply, fill #1

## 2022-05-11 MED ORDER — MYFEMBREE 40-1-0.5 MG PO TABS
1.0000 | ORAL_TABLET | Freq: Every day | ORAL | 5 refills | Status: DC
  Filled 2022-05-11: qty 30, 30d supply, fill #0
  Filled 2022-07-03: qty 28, 28d supply, fill #0

## 2022-05-12 ENCOUNTER — Other Ambulatory Visit (HOSPITAL_COMMUNITY): Payer: Self-pay

## 2022-05-13 ENCOUNTER — Other Ambulatory Visit (HOSPITAL_COMMUNITY): Payer: Self-pay

## 2022-06-08 ENCOUNTER — Other Ambulatory Visit (HOSPITAL_COMMUNITY): Payer: Self-pay

## 2022-06-10 ENCOUNTER — Other Ambulatory Visit (HOSPITAL_COMMUNITY): Payer: Self-pay

## 2022-06-17 ENCOUNTER — Encounter: Payer: No Typology Code available for payment source | Admitting: Nurse Practitioner

## 2022-06-24 ENCOUNTER — Other Ambulatory Visit (HOSPITAL_COMMUNITY): Payer: Self-pay

## 2022-06-29 ENCOUNTER — Other Ambulatory Visit (HOSPITAL_COMMUNITY): Payer: Self-pay

## 2022-06-30 ENCOUNTER — Other Ambulatory Visit (HOSPITAL_COMMUNITY): Payer: Self-pay

## 2022-07-03 ENCOUNTER — Other Ambulatory Visit (HOSPITAL_COMMUNITY): Payer: Self-pay

## 2022-07-06 ENCOUNTER — Other Ambulatory Visit (HOSPITAL_COMMUNITY): Payer: Self-pay

## 2022-07-14 ENCOUNTER — Other Ambulatory Visit (HOSPITAL_COMMUNITY): Payer: Self-pay

## 2022-07-30 ENCOUNTER — Telehealth: Payer: No Typology Code available for payment source | Admitting: Physician Assistant

## 2022-07-30 DIAGNOSIS — B3731 Acute candidiasis of vulva and vagina: Secondary | ICD-10-CM

## 2022-07-31 ENCOUNTER — Other Ambulatory Visit (HOSPITAL_COMMUNITY): Payer: Self-pay

## 2022-07-31 MED ORDER — FLUCONAZOLE 200 MG PO TABS
200.0000 mg | ORAL_TABLET | Freq: Once | ORAL | 0 refills | Status: AC
  Filled 2022-07-31: qty 1, 1d supply, fill #0

## 2022-07-31 NOTE — Progress Notes (Signed)

## 2022-08-10 ENCOUNTER — Other Ambulatory Visit (HOSPITAL_COMMUNITY): Payer: Self-pay

## 2022-08-26 ENCOUNTER — Telehealth: Payer: Self-pay | Admitting: Nurse Practitioner

## 2022-08-26 NOTE — Telephone Encounter (Signed)
Called pt to schedule HM appt no answer left VM

## 2022-10-14 ENCOUNTER — Other Ambulatory Visit (HOSPITAL_COMMUNITY)
Admission: RE | Admit: 2022-10-14 | Discharge: 2022-10-14 | Disposition: A | Discharge status: Home / Self Care | Payer: No Typology Code available for payment source | Source: Ambulatory Visit | Attending: Endocrinology | Admitting: Endocrinology

## 2022-10-14 DIAGNOSIS — E221 Hyperprolactinemia: Secondary | ICD-10-CM | POA: Diagnosis present

## 2022-10-14 DIAGNOSIS — R946 Abnormal results of thyroid function studies: Secondary | ICD-10-CM | POA: Diagnosis present

## 2022-10-14 LAB — TSH: TSH: 1.389 u[IU]/mL (ref 0.350–4.500)

## 2022-10-17 LAB — PROLACTIN: Prolactin: 22.8 ng/mL (ref 4.8–23.3)

## 2022-12-07 ENCOUNTER — Other Ambulatory Visit (HOSPITAL_COMMUNITY)
Admission: RE | Admit: 2022-12-07 | Discharge: 2022-12-07 | Disposition: A | Discharge status: Home / Self Care | Payer: No Typology Code available for payment source | Source: Ambulatory Visit | Attending: Endocrinology | Admitting: Endocrinology

## 2022-12-07 ENCOUNTER — Other Ambulatory Visit: Payer: Self-pay | Admitting: Endocrinology

## 2022-12-07 DIAGNOSIS — E221 Hyperprolactinemia: Secondary | ICD-10-CM | POA: Diagnosis not present

## 2022-12-07 DIAGNOSIS — R946 Abnormal results of thyroid function studies: Secondary | ICD-10-CM | POA: Diagnosis present

## 2022-12-07 DIAGNOSIS — N926 Irregular menstruation, unspecified: Secondary | ICD-10-CM | POA: Insufficient documentation

## 2022-12-07 LAB — HCG, SERUM, QUALITATIVE: Preg, Serum: NEGATIVE

## 2022-12-07 LAB — TSH: TSH: 1.948 u[IU]/mL (ref 0.350–4.500)

## 2022-12-09 LAB — PROLACTIN: Prolactin: 27 ng/mL (ref 4.8–33.4)

## 2022-12-09 LAB — LUTEINIZING HORMONE: LH: 48.3 m[IU]/mL

## 2022-12-09 LAB — FOLLICLE STIMULATING HORMONE: FSH: 18.1 m[IU]/mL

## 2022-12-12 ENCOUNTER — Encounter (HOSPITAL_BASED_OUTPATIENT_CLINIC_OR_DEPARTMENT_OTHER): Payer: Self-pay | Admitting: Emergency Medicine

## 2022-12-12 ENCOUNTER — Emergency Department (HOSPITAL_BASED_OUTPATIENT_CLINIC_OR_DEPARTMENT_OTHER)
Admission: EM | Admit: 2022-12-12 | Discharge: 2022-12-12 | Disposition: A | Discharge status: Home / Self Care | Payer: No Typology Code available for payment source | Attending: Emergency Medicine | Admitting: Emergency Medicine

## 2022-12-12 ENCOUNTER — Other Ambulatory Visit: Payer: Self-pay

## 2022-12-12 DIAGNOSIS — J029 Acute pharyngitis, unspecified: Secondary | ICD-10-CM | POA: Diagnosis not present

## 2022-12-12 DIAGNOSIS — H9202 Otalgia, left ear: Secondary | ICD-10-CM | POA: Insufficient documentation

## 2022-12-12 DIAGNOSIS — Z1152 Encounter for screening for COVID-19: Secondary | ICD-10-CM | POA: Insufficient documentation

## 2022-12-12 LAB — RESP PANEL BY RT-PCR (RSV, FLU A&B, COVID)  RVPGX2
Influenza A by PCR: NEGATIVE
Influenza B by PCR: NEGATIVE
Resp Syncytial Virus by PCR: NEGATIVE
SARS Coronavirus 2 by RT PCR: NEGATIVE

## 2022-12-12 LAB — GROUP A STREP BY PCR: Group A Strep by PCR: NOT DETECTED

## 2022-12-12 NOTE — ED Provider Notes (Addendum)
[facility] Provider Note   CSN: [id number] Arrival date & time: 12/12/22  3299     History  Chief Complaint  Patient presents with   Sore Throat    Kimberly Cooper is a 31 y.o. female.  Patient with a complaint of sore throat for 3 days.  Also with left ear pain.  No real cough but some mild congestion.  No fevers no Cooper.  Past medical history noncontributory.       Home Medications Prior to Admission medications   Medication Sig Start Date End Date Taking? Authorizing Provider  ALPRAZolam Duanne Moron) 0.5 MG tablet Take 1 tablet (0.5 mg total) by mouth 30 minutes before MRI 02/10/22     benzonatate (TESSALON) 100 MG capsule Take 1 capsule (100 mg total) by mouth 3 (three) times daily as needed. 03/30/22   Mar Daring, PA-C  brompheniramine-pseudoephedrine-DM 30-2-10 MG/5ML syrup Take 5 mLs by mouth 4 (four) times daily as needed. 03/30/22   Mar Daring, PA-C  cabergoline (DOSTINEX) 0.5 MG tablet Take 1 tablet by mouth saturday & wednesday 02/13/22     cabergoline (DOSTINEX) 0.5 MG tablet Take 1 tablet by mouth 2 times a week on Saturday and Wednesday. 04/23/22     Dapsone 5 % topical gel Apply 1 application topically to the face every morning. 03/02/22     EPINEPHrine 0.3 mg/0.3 mL IJ SOAJ injection Inject 0.3 mg into the muscle as needed for anaphylaxis. 08/21/21   Ghumman, Ramandeep, NP  Fluocinonide 0.1 % CREA APPLY A SMALL AMOUNT TO AFFECTED AREA TWICE A DAY FOR 2 WEEKS ON THEN 1 WEEK OFF 07/03/21     hydroquinone 4 % cream Apply a small amount to affected area twice a day as needed until desired color is achieved 05/08/21     hydrOXYzine (ATARAX/VISTARIL) 10 MG tablet Take 1-3 tablets (10-30 mg total) by mouth at bedtime as needed for itching 07/03/21     ipratropium (ATROVENT) 0.03 % nasal spray Place 2 sprays into both nostrils every 12 (twelve) hours. 03/30/22   Mar Daring, PA-C  levocetirizine (XYZAL) 5 MG tablet Take 1  tablet (5 mg total) by mouth every evening for 3 weeks 07/03/21     naproxen (NAPROSYN) 500 MG tablet Take 1 tablet (500 mg total) by mouth 2 (two) times daily with a meal as needed for pain 12/18/21   Bary Castilla, NP  norethindrone (AYGESTIN) 5 MG tablet Take 1 tablet (5 mg total) by mouth daily. 04/01/22     Relugolix-Estradiol-Norethind (MYFEMBREE) 40-1-0.5 MG TABS Take 1 tablet by mouth daily. 05/11/22     Relugolix-Estradiol-Norethind (MYFEMBREE) 40-1-0.5 MG TABS Take 1 tablet by mouth daily. 05/11/22     tranexamic acid (LYSTEDA) 650 MG TABS tablet Take 2 tablets (1,300 mg total) by mouth every 8 (eight) hours as needed for 5 days. 01/12/22     tretinoin (RETIN-A) 0.1 % cream Apply to face every evening as tolerated 06/18/20     tretinoin (RETIN-A) 0.1 % cream Apply pea size amount to face nightly followed by moisturizer 05/08/21     triamcinolone-CeraVe Apply 1 application topically 2 (two) times daily to affected areas as needed. 06/18/20     Vitamin D, Ergocalciferol, (DRISDOL) 1.25 MG (50000 UNIT) CAPS capsule Take 1 capsule by mouth on Tuesday and 1 capsule on Fridays. 06/11/21   Bary Castilla, NP      Allergies    Other and Penicillins    Review of Systems  Review of Systems  Constitutional:  Negative for Cooper and fever.  HENT:  Positive for congestion, ear pain and sore throat.   Eyes:  Negative for pain and visual disturbance.  Respiratory:  Negative for cough and shortness of breath.   Cardiovascular:  Negative for chest pain and palpitations.  Gastrointestinal:  Negative for abdominal pain and vomiting.  Genitourinary:  Negative for dysuria and hematuria.  Musculoskeletal:  Negative for arthralgias and back pain.  Skin:  Negative for color change and rash.  Neurological:  Negative for seizures and syncope.  All other systems reviewed and are negative.   Physical Exam Updated Vital Signs BP 111/80   Pulse (!) 102   Temp 98 F (36.7 C) (Oral)   Resp 16   SpO2  98%  Physical Exam Vitals and nursing note reviewed.  Constitutional:      General: She is not in acute distress.    Appearance: She is well-developed.  HENT:     Head: Normocephalic and atraumatic.     Right Ear: Tympanic membrane and ear canal normal.     Left Ear: Tympanic membrane and ear canal normal.     Mouth/Throat:     Mouth: Mucous membranes are dry.     Pharynx: Oropharynx is clear. No pharyngeal swelling, oropharyngeal exudate, posterior oropharyngeal erythema or uvula swelling.     Tonsils: No tonsillar exudate or tonsillar abscesses.     Comments: White coating to the tongue. Eyes:     Conjunctiva/sclera: Conjunctivae normal.     Pupils: Pupils are equal, round, and reactive to light.  Cardiovascular:     Rate and Rhythm: Normal rate and regular rhythm.     Heart sounds: No murmur heard. Pulmonary:     Effort: Pulmonary effort is normal. No respiratory distress.     Breath sounds: Normal breath sounds. No wheezing, rhonchi or rales.  Abdominal:     Palpations: Abdomen is soft.     Tenderness: There is no abdominal tenderness.  Musculoskeletal:        General: No swelling.     Cervical back: Neck supple.  Lymphadenopathy:     Cervical: No cervical adenopathy.  Skin:    General: Skin is warm and dry.     Capillary Refill: Capillary refill takes less than 2 seconds.  Neurological:     General: No focal deficit present.     Mental Status: She is alert and oriented to person, place, and time.  Psychiatric:        Mood and Affect: Mood normal.     ED Results / Procedures / Treatments   Labs (all labs ordered are listed, but only abnormal results are displayed) Labs Reviewed  GROUP A STREP BY PCR  RESP PANEL BY RT-PCR (RSV, FLU A&B, COVID)  [id number]    EKG None  Radiology No results found.  Procedures Procedures    Medications Ordered in ED Medications - No data to display  ED Course/ Medical Decision Making/ A&P                            Medical Decision Making  Patient's oropharynx without any concerning findings.  Nothing consistent with significant tonsillar exudate or peritonsillar abscess.  No cervical adenopathy.  Will go ahead and test for strep and respiratory panel for COVID flu and RSV.  Patient nontoxic no acute distress.  If strep positive will treat with antibiotics otherwise can be treated symptomatically with  over-the-counter medications.  Patient does not need a work note.  Patient strep testing negative COVID influenza negative.  Most likely viral pharyngitis.  Uncomplicated.  Symptomatic treatment.    Final Clinical Impression(s) / ED Diagnoses Final diagnoses:  Pharyngitis, unspecified etiology    Rx / DC Orders ED Discharge Orders     None         Fredia Sorrow, MD 12/12/22 Frederich Balding    Fredia Sorrow, MD 12/12/22 [phone]

## 2022-12-12 NOTE — Discharge Instructions (Signed)
Strep negative COVID influenza RSV negative.  Symptomatic treatment for flulike illness.  Return for any new or worse symptoms.  Recommend Motrin or extra strength Tylenol for throat pain.

## 2022-12-12 NOTE — ED Triage Notes (Signed)
Pt reports sore throat for the past 3 days. Has slight runny nose, but no other URI symptoms. Also having L ear pain.

## 2022-12-17 ENCOUNTER — Telehealth: Payer: Self-pay

## 2022-12-17 NOTE — Telephone Encounter (Signed)
Transition Care Management Unsuccessful Follow-up Telephone Call  Date of discharge and from where:  12/12/2022 urgent care  Attempts:  1st Attempt  Reason for unsuccessful TCM follow-up call:  Left voice message

## 2022-12-17 NOTE — Telephone Encounter (Signed)
Transition Care Management Unsuccessful Follow-up Telephone Call  Date of discharge and from where:  12/12/2022  Attempts:  2nd Attempt  Reason for unsuccessful TCM follow-up call:  Left voice message

## 2022-12-18 ENCOUNTER — Telehealth: Payer: Self-pay

## 2022-12-18 NOTE — Telephone Encounter (Signed)
Transition Care Management Follow-up Telephone Call Date of discharge and from where: 12/12/2022 [facility]  How have you been since you were released from the hospital? Pt states she is doing fine. She does not feel sick, she still feels sore throat. She is alternating tylenol & motrin.  Any questions or concerns? No  Items Reviewed: Did the pt receive and understand the discharge instructions provided? Yes  Medications obtained and verified? Yes  Other? No  Any new allergies since your discharge? No  Dietary orders reviewed? No Do you have support at home? No   Home Care and Equipment/Supplies: Were home health services ordered? no If so, what is the name of the agency? N/a  Has the agency set up a time to come to the patient's home? no Were any new equipment or medical supplies ordered?  No What is the name of the medical supply agency? N/a Were you able to get the supplies/equipment? no Do you have any questions related to the use of the equipment or supplies? No  Functional Questionnaire: (I = Independent and D = Dependent) ADLs: i  Bathing/Dressing- i  Meal Prep- i  Eating- i  Maintaining continence- i  Transferring/Ambulation- i  Managing Meds- i  Follow up appointments reviewed:  PCP Hospital f/u appt confirmed? No  Scheduled to see n/a on n/a @ n/a. [facility] f/u appt confirmed? No  Scheduled to see n/a on n/a @ n/a. Are transportation arrangements needed? No  If their condition worsens, is the pt aware to call PCP or go to the Emergency Dept.? Yes Was the patient provided with contact information for the PCP's office or ED? Yes Was to pt encouraged to call back with questions or concerns? Yes

## 2022-12-19 ENCOUNTER — Telehealth: Payer: No Typology Code available for payment source | Admitting: Physician Assistant

## 2022-12-19 DIAGNOSIS — R21 Rash and other nonspecific skin eruption: Secondary | ICD-10-CM

## 2022-12-19 MED ORDER — PREDNISONE 5 MG (21) PO TBPK: ORAL_TABLET | ORAL | 0 refills | Status: DC

## 2022-12-19 NOTE — Progress Notes (Signed)
E Visit for Rash  We are sorry that you are not feeling well. Here is how we plan to help!  Based on what you shared with me it looks like you have contact dermatitis.  Contact dermatitis is a skin rash caused by something that touches the skin and causes irritation or inflammation.  Your skin may be red, swollen, dry, cracked, and itch.  The rash should go away in a few days but can last a few weeks.  If you get a rash, it's important to figure out what caused it so the irritant can be avoided in the future.  Prednisone 5 mg daily for 6 days (see taper instructions below)  Directions for 6 day taper: Day 1: 2 tablets before breakfast, 1 after both lunch & dinner and 2 at bedtime Day 2: 1 tab before breakfast, 1 after both lunch & dinner and 2 at bedtime Day 3: 1 tab at each meal & 1 at bedtime Day 4: 1 tab at breakfast, 1 at lunch, 1 at bedtime Day 5: 1 tab at breakfast & 1 tab at bedtime Day 6: 1 tab at breakfast  HOME CARE:  Take cool showers and avoid direct sunlight. Apply cool compress or wet dressings. Take a bath in an oatmeal bath.  Sprinkle content of one Aveeno packet under running faucet with comfortably warm water.  Bathe for 15-20 minutes, 1-2 times daily.  Pat dry with a towel. Do not rub the rash. Use hydrocortisone cream. Take an antihistamine like Benadryl for widespread rashes that itch.  The adult dose of Benadryl is 25-50 mg by mouth 4 times daily. Caution:  This type of medication may cause sleepiness.  Do not drink alcohol, drive, or operate dangerous machinery while taking antihistamines.  Do not take these medications if you have prostate enlargement.  Read package instructions thoroughly on all medications that you take.  GET HELP RIGHT AWAY IF:  Symptoms don't go away after treatment. Severe itching that persists. If you rash spreads or swells. If you rash begins to smell. If it blisters and opens or develops a yellow-brown crust. You develop a fever. You  have a sore throat. You become short of breath.  MAKE SURE YOU:  Understand these instructions. Will watch your condition. Will get help right away if you are not doing well or get worse.  Thank you for choosing an e-visit.  Your e-visit answers were reviewed by a board certified advanced clinical practitioner to complete your personal care plan. Depending upon the condition, your plan could have included both over the counter or prescription medications.  Please review your pharmacy choice. Make sure the pharmacy is open so you can pick up prescription now. If there is a problem, you may contact your provider through MyChart messaging and have the prescription routed to another pharmacy.  Your safety is important to us. If you have drug allergies check your prescription carefully.   For the next 24 hours you can use MyChart to ask questions about today's visit, request a non-urgent call back, or ask for a work or school excuse. You will get an email in the next two days asking about your experience. I hope that your e-visit has been valuable and will speed your recovery.   I have spent 5 minutes in review of e-visit questionnaire, review and updating patient chart, medical decision making and response to patient.   Lisett Dirusso Z Ward, PA-C    

## 2023-03-09 ENCOUNTER — Institutional Professional Consult (permissible substitution): Payer: Self-pay | Admitting: Plastic Surgery

## 2023-04-08 ENCOUNTER — Institutional Professional Consult (permissible substitution): Payer: Self-pay | Admitting: Plastic Surgery

## 2023-04-08 ENCOUNTER — Other Ambulatory Visit (HOSPITAL_COMMUNITY): Payer: Self-pay

## 2023-04-08 DIAGNOSIS — R87810 Cervical high risk human papillomavirus (HPV) DNA test positive: Secondary | ICD-10-CM | POA: Diagnosis not present

## 2023-04-08 DIAGNOSIS — Z01419 Encounter for gynecological examination (general) (routine) without abnormal findings: Secondary | ICD-10-CM | POA: Diagnosis not present

## 2023-04-08 DIAGNOSIS — Z319 Encounter for procreative management, unspecified: Secondary | ICD-10-CM | POA: Diagnosis not present

## 2023-04-08 DIAGNOSIS — D259 Leiomyoma of uterus, unspecified: Secondary | ICD-10-CM | POA: Diagnosis not present

## 2023-04-08 DIAGNOSIS — E221 Hyperprolactinemia: Secondary | ICD-10-CM | POA: Diagnosis not present

## 2023-04-08 DIAGNOSIS — Z0000 Encounter for general adult medical examination without abnormal findings: Secondary | ICD-10-CM | POA: Diagnosis not present

## 2023-04-08 DIAGNOSIS — N760 Acute vaginitis: Secondary | ICD-10-CM | POA: Diagnosis not present

## 2023-04-08 DIAGNOSIS — R102 Pelvic and perineal pain: Secondary | ICD-10-CM | POA: Diagnosis not present

## 2023-04-08 DIAGNOSIS — Z124 Encounter for screening for malignant neoplasm of cervix: Secondary | ICD-10-CM | POA: Diagnosis not present

## 2023-04-08 MED ORDER — TINIDAZOLE 500 MG PO TABS
2000.0000 mg | ORAL_TABLET | Freq: Every day | ORAL | 1 refills | Status: DC
  Filled 2023-04-08: qty 8, 2d supply, fill #0

## 2023-04-08 MED ORDER — NORETHINDRONE ACETATE 5 MG PO TABS
10.0000 mg | ORAL_TABLET | Freq: Every day | ORAL | 1 refills | Status: DC
  Filled 2023-04-08: qty 60, 30d supply, fill #0
  Filled 2023-05-03: qty 60, 30d supply, fill #1

## 2023-04-15 ENCOUNTER — Other Ambulatory Visit (HOSPITAL_COMMUNITY): Payer: Self-pay

## 2023-04-15 MED ORDER — TRUE VITAMIN D3 1.25 MG (50000 UT) PO CAPS
50000.0000 [IU] | ORAL_CAPSULE | ORAL | 0 refills | Status: DC
  Filled 2023-04-15: qty 8, 56d supply, fill #0

## 2023-04-22 ENCOUNTER — Other Ambulatory Visit (HOSPITAL_COMMUNITY): Payer: Self-pay

## 2023-05-28 ENCOUNTER — Other Ambulatory Visit (HOSPITAL_COMMUNITY): Payer: Self-pay

## 2023-05-28 MED ORDER — NORETHINDRONE ACETATE 5 MG PO TABS
10.0000 mg | ORAL_TABLET | Freq: Every day | ORAL | 1 refills | Status: DC
  Filled 2023-05-28: qty 60, 30d supply, fill #0

## 2023-06-09 ENCOUNTER — Other Ambulatory Visit (HOSPITAL_COMMUNITY): Payer: Self-pay

## 2023-06-10 DIAGNOSIS — R87610 Atypical squamous cells of undetermined significance on cytologic smear of cervix (ASC-US): Secondary | ICD-10-CM | POA: Diagnosis not present

## 2023-06-11 DIAGNOSIS — Z113 Encounter for screening for infections with a predominantly sexual mode of transmission: Secondary | ICD-10-CM | POA: Diagnosis not present

## 2023-06-22 DIAGNOSIS — D069 Carcinoma in situ of cervix, unspecified: Secondary | ICD-10-CM | POA: Diagnosis not present

## 2023-06-22 DIAGNOSIS — N871 Moderate cervical dysplasia: Secondary | ICD-10-CM | POA: Diagnosis not present

## 2023-06-25 ENCOUNTER — Other Ambulatory Visit (HOSPITAL_COMMUNITY): Payer: Self-pay

## 2023-06-25 DIAGNOSIS — N871 Moderate cervical dysplasia: Secondary | ICD-10-CM | POA: Diagnosis not present

## 2023-06-25 DIAGNOSIS — D069 Carcinoma in situ of cervix, unspecified: Secondary | ICD-10-CM | POA: Diagnosis not present

## 2023-06-25 MED ORDER — OXYCODONE HCL 5 MG PO TABS
5.0000 mg | ORAL_TABLET | Freq: Four times a day (QID) | ORAL | 0 refills | Status: DC | PRN
  Filled 2023-06-25: qty 10, 3d supply, fill #0

## 2023-06-25 MED ORDER — IBUPROFEN 600 MG PO TABS
600.0000 mg | ORAL_TABLET | Freq: Four times a day (QID) | ORAL | 1 refills | Status: DC
  Filled 2023-06-25: qty 30, 8d supply, fill #0
  Filled 2023-09-28: qty 30, 8d supply, fill #1

## 2023-07-05 ENCOUNTER — Other Ambulatory Visit (HOSPITAL_COMMUNITY): Payer: Self-pay

## 2023-07-05 DIAGNOSIS — Z09 Encounter for follow-up examination after completed treatment for conditions other than malignant neoplasm: Secondary | ICD-10-CM | POA: Diagnosis not present

## 2023-07-05 DIAGNOSIS — N920 Excessive and frequent menstruation with regular cycle: Secondary | ICD-10-CM | POA: Diagnosis not present

## 2023-07-05 MED ORDER — NORETHINDRONE ACETATE 5 MG PO TABS
10.0000 mg | ORAL_TABLET | Freq: Every day | ORAL | 1 refills | Status: DC
  Filled 2023-07-05: qty 60, 30d supply, fill #0

## 2023-07-06 ENCOUNTER — Other Ambulatory Visit (HOSPITAL_COMMUNITY): Payer: Self-pay

## 2023-07-07 ENCOUNTER — Other Ambulatory Visit (HOSPITAL_COMMUNITY): Payer: Self-pay

## 2023-07-07 MED ORDER — TINIDAZOLE 500 MG PO TABS
2000.0000 mg | ORAL_TABLET | Freq: Every day | ORAL | 1 refills | Status: DC
  Filled 2023-07-07: qty 8, 2d supply, fill #0

## 2023-07-18 ENCOUNTER — Telehealth: Payer: 59 | Admitting: Nurse Practitioner

## 2023-07-18 ENCOUNTER — Other Ambulatory Visit (HOSPITAL_COMMUNITY): Payer: Self-pay

## 2023-07-18 DIAGNOSIS — H6990 Unspecified Eustachian tube disorder, unspecified ear: Secondary | ICD-10-CM | POA: Diagnosis not present

## 2023-07-18 MED ORDER — FLUTICASONE PROPIONATE 50 MCG/ACT NA SUSP
2.0000 | Freq: Every day | NASAL | 6 refills | Status: DC
  Filled 2023-07-18: qty 16, 30d supply, fill #0

## 2023-07-18 NOTE — Progress Notes (Signed)
I have spent 5 minutes in review of e-visit questionnaire, review and updating patient chart, medical decision making and response to patient.  ° °Zelda W Fleming, NP ° °  °

## 2023-07-18 NOTE — Progress Notes (Signed)
We are sorry that you are not feeling well. Here is how we plan to help!  Based on what you have shared with me it looks like you have Eustachian Tube Dysfunction.  Eustachian Tube Dysfunction is a condition where the tubes that connect your middle ears to your upper throat become blocked. This can lead to discomfort, hearing difficulties and a feeling of fullness in your ear. Eustachian tube dysfunction usually resolves itself in a few days. The usual symptoms include: Hearing problems Tinnitus, or ringing in your ears Clicking or popping sounds A feeling of fullness in your ears Pain that mimics an ear infection Dizziness, vertigo or balance problems A "tickling" sensation in your ears  ?Eustachian tube dysfunction symptoms may get worse in higher altitudes. This is called barotrauma, and it can happen while scuba diving, flying in an airplane or driving in the mountains.   What causes eustachian tube dysfunction? Allergies and infections (like the common cold and the flu) are the most common causes of eustachian tube dysfunction. These conditions can cause inflammation and mucus buildup, leading to blockage. GERD, or chronic acid reflux, can also cause ETD. This is because stomach acid can back up into your throat and result in inflammation. As mentioned above, altitude changes can also cause ETD.   What are some common eustachian tube dysfunction treatments? In most cases, treatment isn't necessary because ETD often resolves on its own. However, you might need treatment if your symptoms linger for more than two weeks.    Eustachian tube dysfunction treatment depends on the cause and the severity of your condition. Treatments may include home remedies, medications or, in severe cases, surgery.     HOME CARE: Sometimes simple home remedies can help with mild cases of eustachian tube dysfunction. To try and clear the blockage, you can: Chew gum. Yawn. Swallow. Try the Valsalva maneuver  (breathing out forcefully while closing your mouth and pinching your nostrils). Use a saline spray to clear out nasal passages.  MEDICATIONS: Over-the-counter medications can help if allergies are causing eustachian tube dysfunction. Try antihistamines (like cetirizine or diphenhydramine) to ease your symptoms. If you have discomfort, pain relievers -- such as acetaminophen or ibuprofen -- can help.  Sometimes intranasal glucocorticosteroids (like Flonase or Nasacort) help.  I have prescribed Fluticasone 50 mcg/spray 2 sprays in each nostril daily for 10-14 days    GET HELP RIGHT AWAY IF: Fever is over 102.2 degrees. You develop progressive ear pain or hearing loss. Ear symptoms persist longer than 3 days after treatment.  MAKE SURE YOU: Understand these instructions. Will watch your condition. Will get help right away if you are not doing well or get worse.  Thank you for choosing an e-visit.  Your e-visit answers were reviewed by a board certified advanced clinical practitioner to complete your personal care plan. Depending upon the condition, your plan could have included both over the counter or prescription medications.  Please review your pharmacy choice. Make sure the pharmacy is open so you can pick up the prescription now. If there is a problem, you may contact your provider through [employer] and have the prescription routed to another pharmacy.  Your safety is important to [place]. If you have drug allergies check your prescription carefully.   For the next 24 hours you can use MyChart to ask questions about today's visit, request a non-urgent call back, or ask for a work or school excuse. You will get an email with a survey after your eVisit asking  about your experience. We would appreciate your feedback. I hope that your e-visit has been valuable and will aid in your recovery.

## 2023-07-19 ENCOUNTER — Other Ambulatory Visit (HOSPITAL_COMMUNITY): Payer: Self-pay

## 2023-07-28 ENCOUNTER — Other Ambulatory Visit (HOSPITAL_COMMUNITY): Payer: Self-pay

## 2023-08-06 ENCOUNTER — Other Ambulatory Visit (HOSPITAL_COMMUNITY): Payer: Self-pay

## 2023-08-06 DIAGNOSIS — N920 Excessive and frequent menstruation with regular cycle: Secondary | ICD-10-CM | POA: Diagnosis not present

## 2023-08-06 DIAGNOSIS — N871 Moderate cervical dysplasia: Secondary | ICD-10-CM | POA: Diagnosis not present

## 2023-08-06 IMAGING — MG MM DIGITAL SCREENING BILAT W/ TOMO AND CAD
8 series · 8 of 24 positions shown · non-contrast
Comparison: None.

CLINICAL DATA: Screening.

EXAM:
DIGITAL SCREENING BILATERAL MAMMOGRAM WITH TOMOSYNTHESIS AND CAD
TECHNIQUE: Bilateral screening digital craniocaudal and mediolateral oblique
mammograms were obtained. Bilateral screening digital breast
tomosynthesis was performed. The images were evaluated with
computer-aided detection.

[R MLO synth-2D]
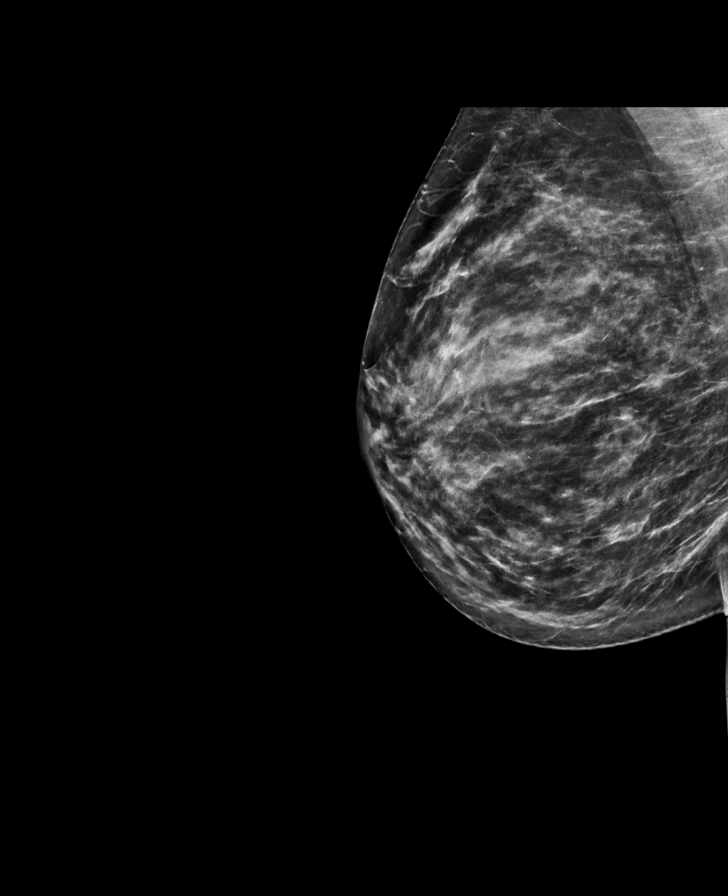

[L MLO synth-2D]
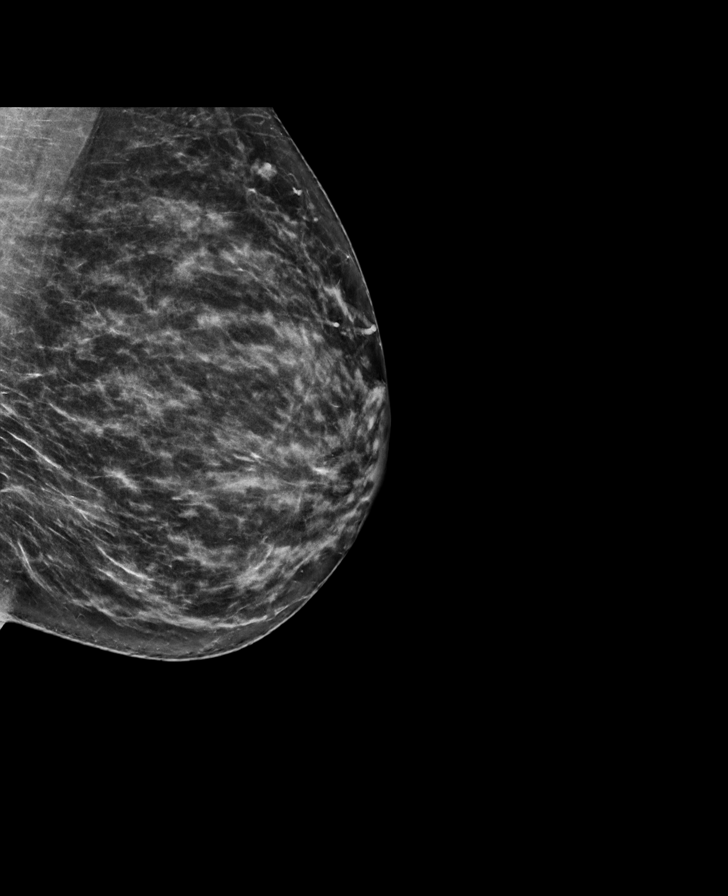

[R CC synth-2D]
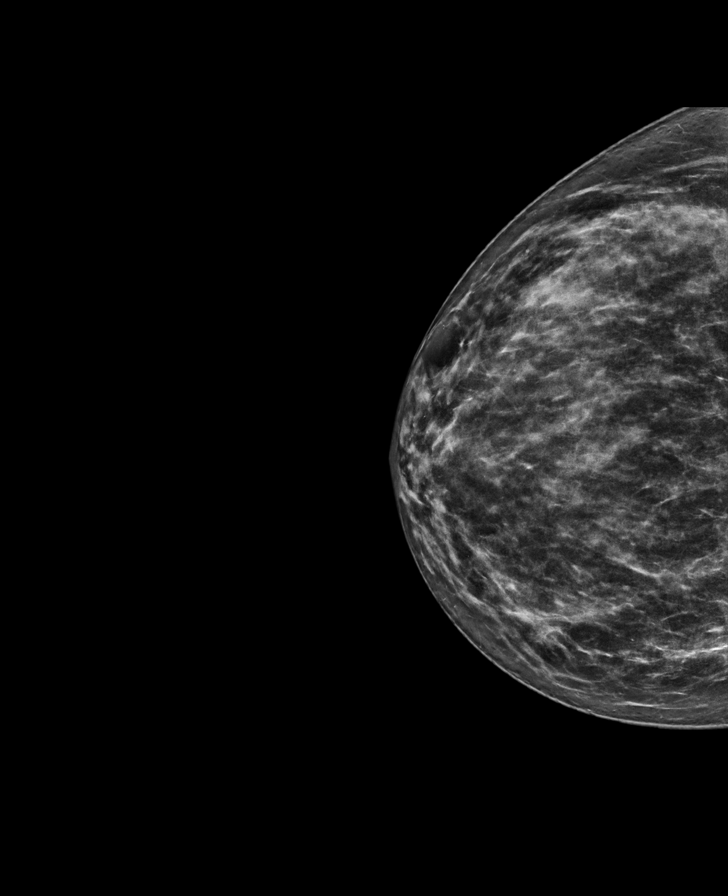

[L CC synth-2D]
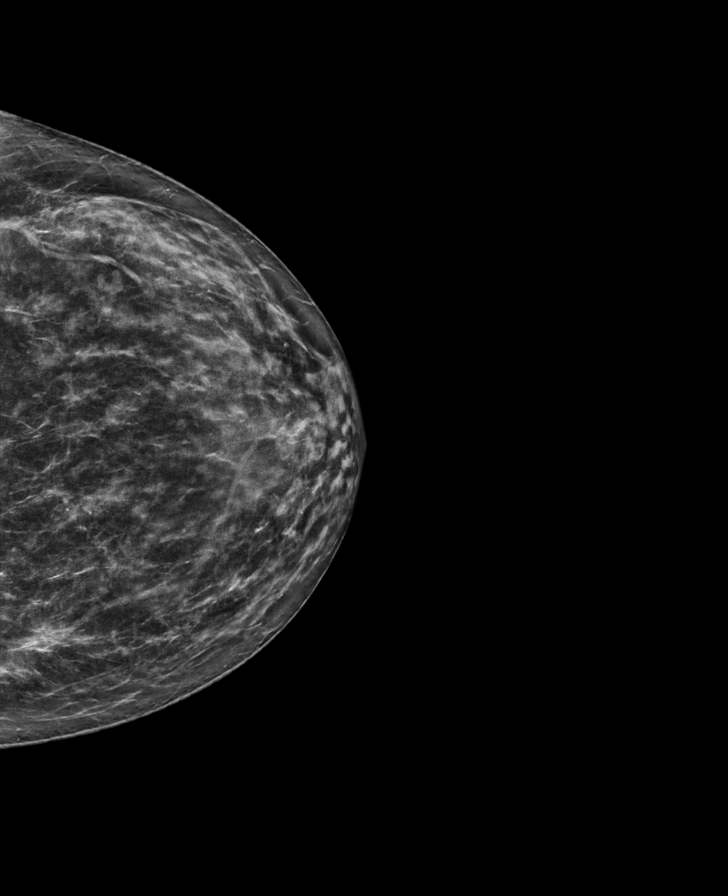

[R CC tomo · tomo slice 34/67.0]
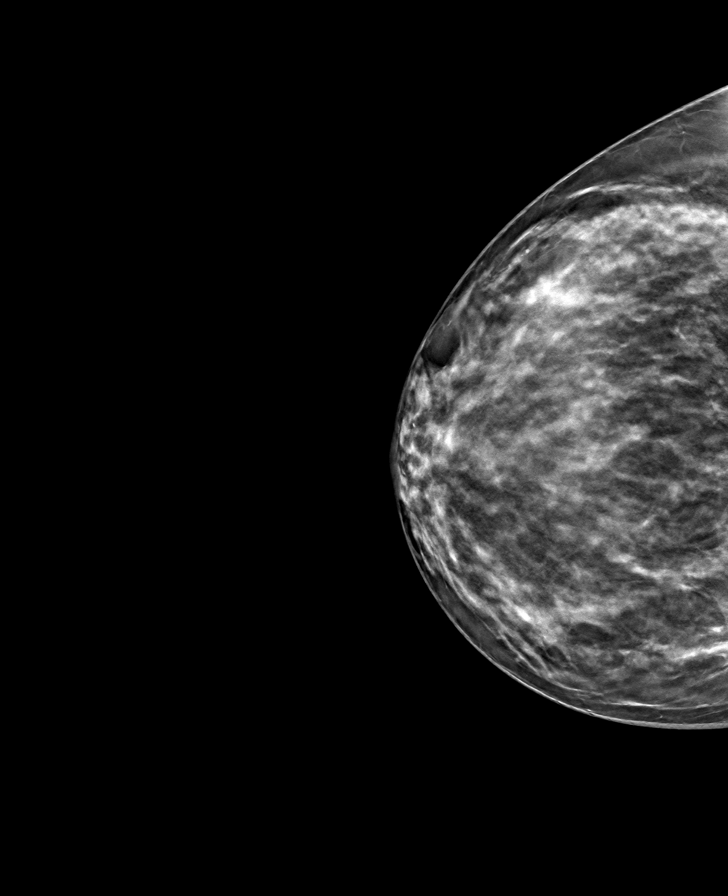

[L MLO tomo · tomo slice 36/71.0]
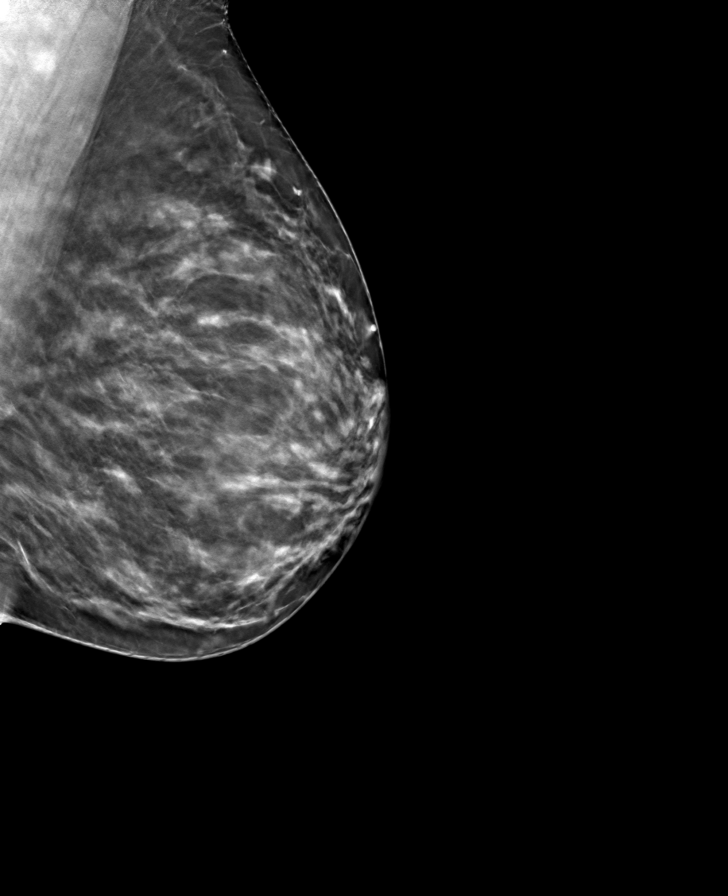

[R MLO tomo · tomo slice 37/73.0]
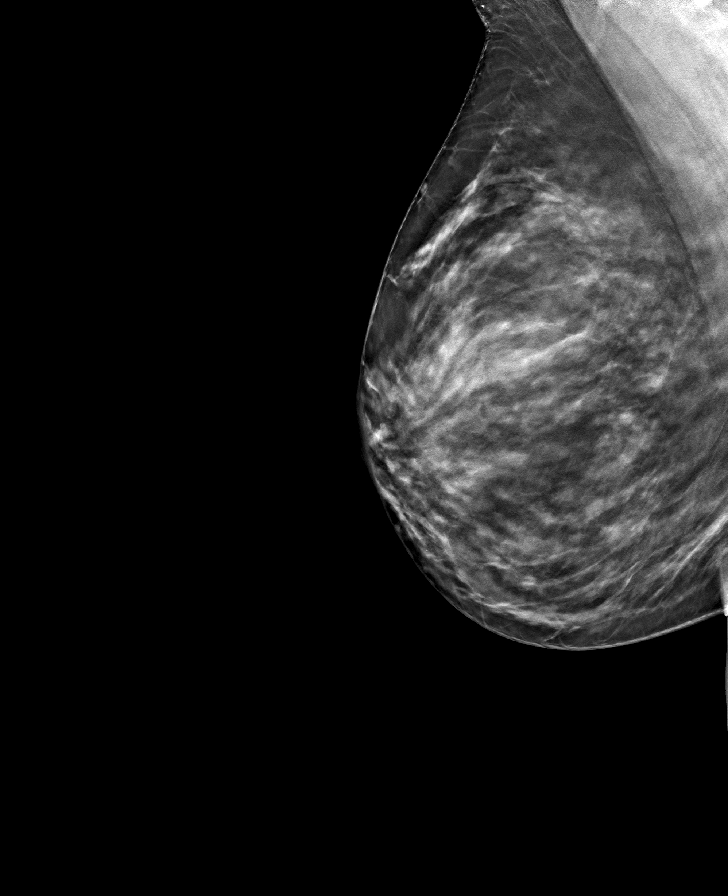

[L CC tomo · tomo slice 36/71.0]
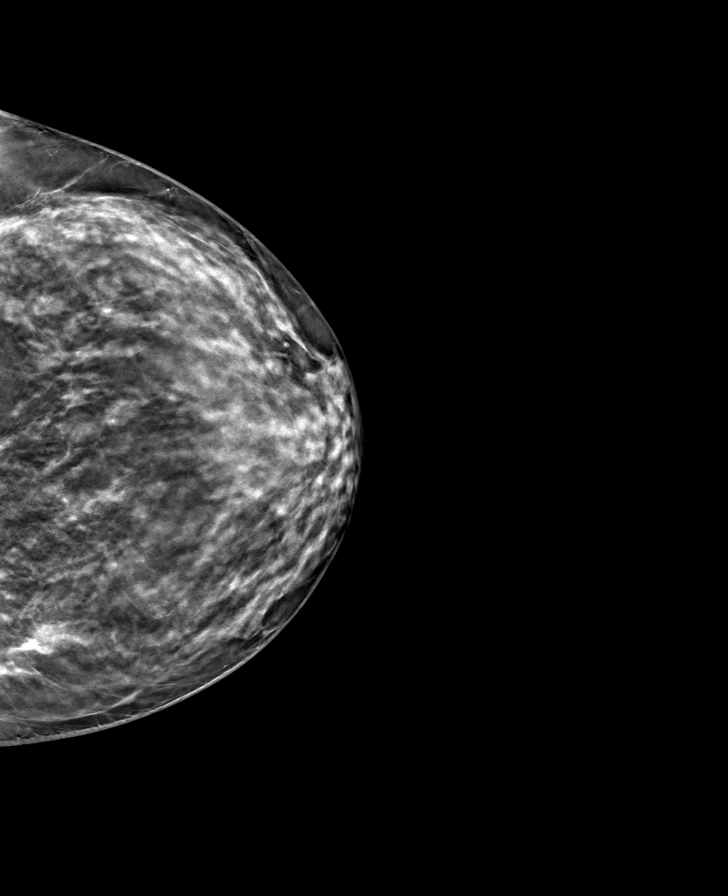

[8 of 24 positions shown; findings below may reference images not displayed]

ACR Breast Density Category c: The breast tissue is heterogeneously
dense, which may obscure small masses
FINDINGS: There are no findings suspicious for malignancy.
IMPRESSION: No mammographic evidence of malignancy. A result letter of this
screening mammogram will be mailed directly to the patient.

RECOMMENDATION:
Screening mammogram at age 40. (Code:VY-0-79Z)

BI-RADS CATEGORY  1: Negative.

## 2023-08-06 MED ORDER — NORETHINDRONE ACETATE 5 MG PO TABS
10.0000 mg | ORAL_TABLET | Freq: Every day | ORAL | 3 refills | Status: DC
  Filled 2023-08-06: qty 180, 90d supply, fill #0

## 2023-09-08 ENCOUNTER — Other Ambulatory Visit (HOSPITAL_COMMUNITY): Payer: Self-pay

## 2023-09-10 NOTE — Progress Notes (Unsigned)
New patient visit   Patient: Kimberly Cooper   DOB: 1990/12/30   32 y.o. Female  MRN: [id number] Visit Date: 09/13/2023  Today's healthcare provider: Alfredia Ferguson, PA-C   No chief complaint on file.  Subjective    Kimberly Cooper is a 32 y.o. female who presents today as a new patient to establish care.  HPI  ***  Past Medical History:  Diagnosis Date   Allergy    Chlamydia    GERD (gastroesophageal reflux disease)    Gonorrhea    MRSA infection greater than 3 months ago 2003   ABDOMINAL CYST   No past surgical history on file. Family Status  Relation Name Status   Mother  Alive   Father  Deceased   Sister  Alive   Mat Aunt  (Not Specified)   Emelda Brothers  (Not Specified)   MGM  Alive   MGF  Deceased   Brother  Alive  No partnership data on file   Family History  Problem Relation Age of Onset   Hypertension Mother    Heart disease Father 75       MI   Other Father        gunshot, paralysis   Breast cancer Maternal Aunt    Breast cancer Paternal Aunt    Cancer Paternal Aunt 40       stomach cancer   Breast cancer Maternal Grandmother    Stroke Maternal Grandmother    Cancer Maternal Grandfather        lung/prostate   Social History   Socioeconomic History   Marital status: Single    Spouse name: Not on file   Number of children: Not on file   Years of education: Not on file   Highest education level: Not on file  Occupational History   Not on file  Tobacco Use   Smoking status: Never   Smokeless tobacco: Never  Vaping Use   Vaping status: Never Used  Substance and Sexual Activity   Alcohol use: Yes    Alcohol/week: 2.0 standard drinks of alcohol    Types: 1 Glasses of wine, 1 Cans of beer per week    Comment: social   Drug use: No   Sexual activity: Yes    Birth control/protection: None  Other Topics Concern   Not on file  Social History Narrative   Not on file   Social Determinants of Health   Financial Resource Strain: Not on file   Food Insecurity: Not on file  Transportation Needs: Not on file  Physical Activity: Not on file  Stress: Not on file  Social Connections: Not on file   Outpatient Medications Prior to Visit  Medication Sig   ALPRAZolam (XANAX) 0.5 MG tablet Take 1 tablet (0.5 mg total) by mouth 30 minutes before MRI   benzonatate (TESSALON) 100 MG capsule Take 1 capsule (100 mg total) by mouth 3 (three) times daily as needed.   brompheniramine-pseudoephedrine-DM 30-2-10 MG/5ML syrup Take 5 mLs by mouth 4 (four) times daily as needed.   cabergoline (DOSTINEX) 0.5 MG tablet Take 1 tablet by mouth saturday & wednesday   cabergoline (DOSTINEX) 0.5 MG tablet Take 1 tablet by mouth 2 times a week on Saturday and Wednesday.   Cholecalciferol (TRUE VITAMIN D3) 1.25 MG (50000 UT) capsule Take 1 capsule (50,000 Units total) by mouth once a week.   Dapsone 5 % topical gel Apply 1 application topically to the face every morning.   EPINEPHrine  0.3 mg/0.3 mL IJ SOAJ injection Inject 0.3 mg into the muscle as needed for anaphylaxis.   Fluocinonide 0.1 % CREA APPLY A SMALL AMOUNT TO AFFECTED AREA TWICE A DAY FOR 2 WEEKS ON THEN 1 WEEK OFF   fluticasone (FLONASE) 50 MCG/ACT nasal spray Place 2 sprays into both nostrils daily.   hydroquinone 4 % cream Apply a small amount to affected area twice a day as needed until desired color is achieved   hydrOXYzine (ATARAX/VISTARIL) 10 MG tablet Take 1-3 tablets (10-30 mg total) by mouth at bedtime as needed for itching   ibuprofen (ADVIL) 600 MG tablet Take 1 tablet (600 mg total) by mouth every 6 (six) hours as needed   ipratropium (ATROVENT) 0.03 % nasal spray Place 2 sprays into both nostrils every 12 (twelve) hours.   levocetirizine (XYZAL) 5 MG tablet Take 1 tablet (5 mg total) by mouth every evening for 3 weeks   naproxen (NAPROSYN) 500 MG tablet Take 1 tablet (500 mg total) by mouth 2 (two) times daily with a meal as needed for pain   norethindrone (AYGESTIN) 5 MG tablet Take  1 tablet (5 mg total) by mouth daily.   norethindrone (AYGESTIN) 5 MG tablet Take 2 tablets (10 mg total) by mouth daily.   norethindrone (AYGESTIN) 5 MG tablet Take 2 tablets (10 mg total) by mouth daily.   norethindrone (AYGESTIN) 5 MG tablet Take 2 tablets (10 mg total) by mouth daily.   predniSONE (STERAPRED UNI-PAK 21 TAB) 5 MG (21) TBPK tablet Day 1: 2 tablets before breakfast, 1 after both lunch & dinner and 2 at bedtime Day 2: 1 tab before breakfast, 1 after both lunch & dinner and 2 at bedtime Day 3: 1 tab at each meal & 1 at bedtime Day 4: 1 tab at breakfast, 1 at lunch, 1 at bedtime Day 5: 1 tab at breakfast & 1 tab at bedtime Day 6: 1 tab at breakfast   Relugolix-Estradiol-Norethind (MYFEMBREE) 40-1-0.5 MG TABS Take 1 tablet by mouth daily.   Relugolix-Estradiol-Norethind (MYFEMBREE) 40-1-0.5 MG TABS Take 1 tablet by mouth daily.   tinidazole (TINDAMAX) 500 MG tablet Take 4 tablets (2,000 mg total) by mouth daily for 2 days   tranexamic acid (LYSTEDA) 650 MG TABS tablet Take 2 tablets (1,300 mg total) by mouth every 8 (eight) hours as needed for 5 days.   tretinoin (RETIN-A) 0.1 % cream Apply to face every evening as tolerated   tretinoin (RETIN-A) 0.1 % cream Apply pea size amount to face nightly followed by moisturizer   triamcinolone-CeraVe Apply 1 application topically 2 (two) times daily to affected areas as needed.   Vitamin D, Ergocalciferol, (DRISDOL) 1.25 MG (50000 UNIT) CAPS capsule Take 1 capsule by mouth on Tuesday and 1 capsule on Fridays.   No facility-administered medications prior to visit.   Allergies  Allergen Reactions   Other Hives    All Raw Nuts: Swelling   Penicillins Hives    Immunization History  Administered Date(s) Administered   Influenza-Unspecified 09/10/2018, 10/05/2019   PFIZER(Purple Top)SARS-COV-2 Vaccination 07/16/2020, 08/06/2020    Health Maintenance  Topic Date Due   DTaP/Tdap/Td (1 - Tdap) Never done   INFLUENZA VACCINE  07/22/2023    COVID-19 Vaccine (3 - 2023-24 season) 08/22/2023   Cervical Cancer Screening (HPV/Pap Cotest)  06/10/2024   Hepatitis C Screening  Completed   HIV Screening  Completed   HPV VACCINES  Aged Out    Patient Care Team: Arnette Felts, FNP as PCP - General (  General Practice)  Review of Systems  {Insert previous labs (optional):23779} {See past labs  Heme  Chem  Endocrine  Serology  Results Review (optional):1}   Objective    There were no vitals taken for this visit. {Insert last BP/Wt (optional):23777}{See vitals history (optional):1}   Physical Exam ***  Depression Screen    06/10/2021   11:10 AM 01/09/2020    9:29 AM 01/05/2020    8:07 AM 05/08/2019    8:50 AM  PHQ 2/9 Scores  PHQ - 2 Score 0 0 0 0  PHQ- 9 Score 0      No results found for any visits on 09/13/23.  Assessment & Plan     ***  No follow-ups on file.     {provider attestation***:1}   Alfredia Ferguson, PA-C  [facility] [facility] [phone] (phone) [phone] (fax)  [facility]

## 2023-09-13 ENCOUNTER — Ambulatory Visit (INDEPENDENT_AMBULATORY_CARE_PROVIDER_SITE_OTHER): Payer: 59 | Admitting: Physician Assistant

## 2023-09-13 ENCOUNTER — Encounter: Payer: Self-pay | Admitting: Physician Assistant

## 2023-09-13 VITALS — BP 114/78 | HR 89 | Temp 98.7°F | Ht 61.0 in | Wt 188.2 lb

## 2023-09-13 DIAGNOSIS — Z8619 Personal history of other infectious and parasitic diseases: Secondary | ICD-10-CM

## 2023-09-13 DIAGNOSIS — Z23 Encounter for immunization: Secondary | ICD-10-CM

## 2023-09-13 DIAGNOSIS — Z0000 Encounter for general adult medical examination without abnormal findings: Secondary | ICD-10-CM

## 2023-09-13 DIAGNOSIS — Z0001 Encounter for general adult medical examination with abnormal findings: Secondary | ICD-10-CM

## 2023-09-13 DIAGNOSIS — E559 Vitamin D deficiency, unspecified: Secondary | ICD-10-CM | POA: Insufficient documentation

## 2023-09-13 DIAGNOSIS — Z111 Encounter for screening for respiratory tuberculosis: Secondary | ICD-10-CM

## 2023-09-13 LAB — CBC WITH DIFFERENTIAL/PLATELET
Basophils Absolute: 0.1 10*3/uL (ref 0.0–0.1)
Basophils Relative: 0.5 % (ref 0.0–3.0)
Eosinophils Absolute: 0.1 10*3/uL (ref 0.0–0.7)
Eosinophils Relative: 1.3 % (ref 0.0–5.0)
HCT: 42.4 % (ref 36.0–46.0)
Hemoglobin: 13.9 g/dL (ref 12.0–15.0)
Lymphocytes Relative: 31.1 % (ref 12.0–46.0)
Lymphs Abs: 3 10*3/uL (ref 0.7–4.0)
MCHC: 32.8 g/dL (ref 30.0–36.0)
MCV: 92.7 fl (ref 78.0–100.0)
Monocytes Absolute: 0.6 10*3/uL (ref 0.1–1.0)
Monocytes Relative: 5.9 % (ref 3.0–12.0)
Neutro Abs: 6 10*3/uL (ref 1.4–7.7)
Neutrophils Relative %: 61.2 % (ref 43.0–77.0)
Platelets: 266 10*3/uL (ref 150.0–400.0)
RBC: 4.57 Mil/uL (ref 3.87–5.11)
RDW: 13.5 % (ref 11.5–15.5)
WBC: 9.8 10*3/uL (ref 4.0–10.5)

## 2023-09-13 LAB — COMPREHENSIVE METABOLIC PANEL
ALT: 7 U/L (ref 0–35)
AST: 10 U/L (ref 0–37)
Albumin: 4.2 g/dL (ref 3.5–5.2)
Alkaline Phosphatase: 45 U/L (ref 39–117)
BUN: 6 mg/dL (ref 6–23)
CO2: 25 mEq/L (ref 19–32)
Calcium: 9.3 mg/dL (ref 8.4–10.5)
Chloride: 103 mEq/L (ref 96–112)
Creatinine, Ser: 0.71 mg/dL (ref 0.40–1.20)
GFR: 112.5 mL/min (ref >60.00)
Glucose, Bld: 73 mg/dL (ref 70–99)
Potassium: 4 mEq/L (ref 3.5–5.1)
Sodium: 136 mEq/L (ref 135–145)
Total Bilirubin: 0.2 mg/dL (ref 0.2–1.2)
Total Protein: 7.8 g/dL (ref 6.0–8.3)

## 2023-09-13 LAB — POCT URINALYSIS DIP (MANUAL ENTRY)
Bilirubin, UA: NEGATIVE
Blood, UA: NEGATIVE
Glucose, UA: NEGATIVE mg/dL
Ketones, POC UA: NEGATIVE mg/dL
Nitrite, UA: NEGATIVE
Protein Ur, POC: NEGATIVE mg/dL
Spec Grav, UA: 1.01 (ref 1.010–1.025)
Urobilinogen, UA: 0.2 E.U./dL
pH, UA: 8 (ref 5.0–8.0)

## 2023-09-13 LAB — VITAMIN D 25 HYDROXY (VIT D DEFICIENCY, FRACTURES): VITD: 31.92 ng/mL (ref 30.00–100.00)

## 2023-09-13 NOTE — Assessment & Plan Note (Signed)
Historically, taking 2,000 IU daily otc

## 2023-09-15 LAB — HEPATITIS B SURFACE ANTIBODY,QUALITATIVE

## 2023-09-17 LAB — QUANTIFERON-TB GOLD PLUS
Mitogen-NIL: 6.38 [IU]/mL
NIL: 0.02 [IU]/mL
QuantiFERON-TB Gold Plus: NEGATIVE
TB1-NIL: 0.03 [IU]/mL
TB2-NIL: 0 [IU]/mL

## 2023-11-06 ENCOUNTER — Other Ambulatory Visit (HOSPITAL_COMMUNITY): Payer: Self-pay

## 2023-11-08 ENCOUNTER — Other Ambulatory Visit (HOSPITAL_COMMUNITY): Payer: Self-pay

## 2023-11-08 MED ORDER — NORETHINDRONE ACETATE 5 MG PO TABS
5.0000 mg | ORAL_TABLET | Freq: Every day | ORAL | 2 refills | Status: DC
  Filled 2023-11-08: qty 41, 41d supply, fill #0

## 2023-11-08 MED ORDER — NORETHINDRONE ACETATE 5 MG PO TABS
10.0000 mg | ORAL_TABLET | Freq: Every day | ORAL | 3 refills | Status: DC
  Filled 2023-11-22: qty 60, 30d supply, fill #0
  Filled 2023-11-24: qty 180, 90d supply, fill #0
  Filled 2024-02-07: qty 180, 90d supply, fill #1

## 2023-11-22 ENCOUNTER — Telehealth: Payer: 59 | Admitting: Physician Assistant

## 2023-11-22 ENCOUNTER — Other Ambulatory Visit (HOSPITAL_COMMUNITY): Payer: Self-pay

## 2023-11-22 ENCOUNTER — Other Ambulatory Visit: Payer: Self-pay

## 2023-11-22 DIAGNOSIS — L282 Other prurigo: Secondary | ICD-10-CM | POA: Diagnosis not present

## 2023-11-22 MED ORDER — FLUOCINONIDE 0.05 % EX CREA
1.0000 | TOPICAL_CREAM | Freq: Two times a day (BID) | CUTANEOUS | 0 refills | Status: DC
  Filled 2023-11-22 (×2): qty 60, 30d supply, fill #0

## 2023-11-22 NOTE — Progress Notes (Signed)
E Visit for Rash  We are sorry that you are not feeling well. Here is how we plan to help!  Based on what you shared with me it looks like you have contact dermatitis.  Contact dermatitis is a skin rash caused by something that touches the skin and causes irritation or inflammation.  Your skin may be red, swollen, dry, cracked, and itch.  The rash should go away in a few days but can last a few weeks.  If you get a rash, it's important to figure out what caused it so the irritant can be avoided in the future.  I have prescribed Fluocinonide cream to apply topically to affected area twice daily.  HOME CARE:  Take cool showers and avoid direct sunlight. Apply cool compress or wet dressings. Take a bath in an oatmeal bath.  Sprinkle content of one Aveeno packet under running faucet with comfortably warm water.  Bathe for 15-20 minutes, 1-2 times daily.  Pat dry with a towel. Do not rub the rash. Use hydrocortisone cream. Take an antihistamine like Benadryl for widespread rashes that itch.  The adult dose of Benadryl is 25-50 mg by mouth 4 times daily. Caution:  This type of medication may cause sleepiness.  Do not drink alcohol, drive, or operate dangerous machinery while taking antihistamines.  Do not take these medications if you have prostate enlargement.  Read package instructions thoroughly on all medications that you take.  GET HELP RIGHT AWAY IF:  Symptoms don't go away after treatment. Severe itching that persists. If you rash spreads or swells. If you rash begins to smell. If it blisters and opens or develops a yellow-brown crust. You develop a fever. You have a sore throat. You become short of breath.  MAKE SURE YOU:  Understand these instructions. Will watch your condition. Will get help right away if you are not doing well or get worse.  Thank you for choosing an e-visit.  Your e-visit answers were reviewed by a board certified advanced clinical practitioner to complete  your personal care plan. Depending upon the condition, your plan could have included both over the counter or prescription medications.  Please review your pharmacy choice. Make sure the pharmacy is open so you can pick up prescription now. If there is a problem, you may contact your provider through [employer] and have the prescription routed to another pharmacy.  Your safety is important to [place]. If you have drug allergies check your prescription carefully.   For the next 24 hours you can use MyChart to ask questions about today's visit, request a non-urgent call back, or ask for a work or school excuse. You will get an email in the next two days asking about your experience. I hope that your e-visit has been valuable and will speed your recovery.   I have spent 5 minutes in review of e-visit questionnaire, review and updating patient chart, medical decision making and response to patient.   Margaretann Loveless, PA-C

## 2023-11-24 ENCOUNTER — Other Ambulatory Visit (HOSPITAL_COMMUNITY): Payer: Self-pay

## 2023-11-26 ENCOUNTER — Other Ambulatory Visit (HOSPITAL_COMMUNITY): Payer: Self-pay

## 2023-12-25 ENCOUNTER — Other Ambulatory Visit: Payer: Self-pay

## 2023-12-25 ENCOUNTER — Encounter (HOSPITAL_BASED_OUTPATIENT_CLINIC_OR_DEPARTMENT_OTHER): Payer: Self-pay

## 2023-12-25 ENCOUNTER — Emergency Department (HOSPITAL_BASED_OUTPATIENT_CLINIC_OR_DEPARTMENT_OTHER)
Admission: EM | Admit: 2023-12-25 | Discharge: 2023-12-25 | Disposition: A | Discharge status: Home / Self Care | Payer: 59 | Attending: Emergency Medicine | Admitting: Emergency Medicine

## 2023-12-25 ENCOUNTER — Emergency Department (HOSPITAL_BASED_OUTPATIENT_CLINIC_OR_DEPARTMENT_OTHER): Payer: 59

## 2023-12-25 DIAGNOSIS — R1084 Generalized abdominal pain: Secondary | ICD-10-CM | POA: Diagnosis not present

## 2023-12-25 DIAGNOSIS — D259 Leiomyoma of uterus, unspecified: Secondary | ICD-10-CM | POA: Diagnosis not present

## 2023-12-25 DIAGNOSIS — N858 Other specified noninflammatory disorders of uterus: Secondary | ICD-10-CM | POA: Diagnosis not present

## 2023-12-25 DIAGNOSIS — R1030 Lower abdominal pain, unspecified: Secondary | ICD-10-CM | POA: Diagnosis not present

## 2023-12-25 DIAGNOSIS — K769 Liver disease, unspecified: Secondary | ICD-10-CM | POA: Diagnosis not present

## 2023-12-25 LAB — COMPREHENSIVE METABOLIC PANEL
ALT: 13 U/L (ref 0–44)
AST: 15 U/L (ref 15–41)
Albumin: 3.6 g/dL (ref 3.5–5.0)
Alkaline Phosphatase: 40 U/L (ref 38–126)
Anion gap: 9 (ref 5–15)
BUN: 6 mg/dL (ref 6–20)
CO2: 24 mmol/L (ref 22–32)
Calcium: 8.7 mg/dL — ABNORMAL LOW (ref 8.9–10.3)
Chloride: 106 mmol/L (ref 98–111)
Creatinine, Ser: 0.63 mg/dL (ref 0.44–1.00)
GFR, Estimated: >60 mL/min (ref >60)
Glucose, Bld: 97 mg/dL (ref 70–99)
Potassium: 3.6 mmol/L (ref 3.5–5.1)
Sodium: 139 mmol/L (ref 135–145)
Total Bilirubin: 0.4 mg/dL (ref 0.0–1.2)
Total Protein: 7.9 g/dL (ref 6.5–8.1)

## 2023-12-25 LAB — URINALYSIS, MICROSCOPIC (REFLEX)

## 2023-12-25 LAB — CBC
HCT: 37.7 % (ref 36.0–46.0)
Hemoglobin: 12.6 g/dL (ref 12.0–15.0)
MCH: 29.9 pg (ref 26.0–34.0)
MCHC: 33.4 g/dL (ref 30.0–36.0)
MCV: 89.3 fL (ref 80.0–100.0)
Platelets: 264 10*3/uL (ref 150–400)
RBC: 4.22 MIL/uL (ref 3.87–5.11)
RDW: 12.5 % (ref 11.5–15.5)
WBC: 9.6 10*3/uL (ref 4.0–10.5)
nRBC: 0 % (ref 0.0–0.2)

## 2023-12-25 LAB — URINALYSIS, ROUTINE W REFLEX MICROSCOPIC
Bilirubin Urine: NEGATIVE
Glucose, UA: NEGATIVE mg/dL
Ketones, ur: NEGATIVE mg/dL
Nitrite: NEGATIVE
Protein, ur: NEGATIVE mg/dL
Specific Gravity, Urine: >=1.03 (ref 1.005–1.030)
pH: 6.5 (ref 5.0–8.0)

## 2023-12-25 LAB — PREGNANCY, URINE: Preg Test, Ur: NEGATIVE

## 2023-12-25 LAB — LIPASE, BLOOD: Lipase: 25 U/L (ref 11–51)

## 2023-12-25 MED ORDER — OXYCODONE HCL 5 MG PO TABS: 5.0000 mg | ORAL_TABLET | Freq: Four times a day (QID) | ORAL | 0 refills | Status: DC | PRN

## 2023-12-25 MED ORDER — IOHEXOL 300 MG/ML  SOLN
80.0000 mL | Freq: Once | INTRAMUSCULAR | Status: AC | PRN
  Administered 2023-12-25: 80 mL via INTRAVENOUS

## 2023-12-25 MED ORDER — KETOROLAC TROMETHAMINE 15 MG/ML IJ SOLN
15.0000 mg | Freq: Once | INTRAMUSCULAR | Status: AC
  Administered 2023-12-25: 15 mg via INTRAVENOUS
  Filled 2023-12-25: qty 1

## 2023-12-25 NOTE — ED Notes (Signed)
 Patient transported to CT

## 2023-12-25 NOTE — ED Provider Notes (Signed)
  EMERGENCY DEPARTMENT AT [facility] Provider Note   CSN: [id number] Arrival date & time: 12/25/23  1310     History  Chief Complaint  Patient presents with   Abdominal Pain    Kimberly Cooper is a 33 y.o. female.  Patient here with lower abdominal pain for the last couple days.  No vaginal bleeding.  No nausea vomiting diarrhea.  She was having some pain with urination.  She has history of fibroids.  She had a LEEP procedure for early cervical cancer recently as well.  She denies any chest pain shortness of breath.  No concern for STDs.  No discharge.  The history is provided by the patient.       Home Medications Prior to Admission medications   Medication Sig Start Date End Date Taking? Authorizing Provider  oxyCODONE  (ROXICODONE ) 5 MG immediate release tablet Take 1 tablet (5 mg total) by mouth every 6 (six) hours as needed for up to 10 doses. 12/25/23  Yes Leannah Guse, DO  Dapsone  5 % topical gel Apply 1 application topically to the face every morning. 03/02/22     EPINEPHrine  0.3 mg/0.3 mL IJ SOAJ injection Inject 0.3 mg into the muscle as needed for anaphylaxis. 08/21/21   Ghumman, Ramandeep, NP  fluocinonide  cream (LIDEX ) 0.05 % Apply 1 Application topically 2 (two) times daily. 11/22/23   Vivienne Delon HERO, PA-C  hydroquinone  4 % cream Apply a small amount to affected area twice a day as needed until desired color is achieved 05/08/21     ibuprofen  (ADVIL ) 600 MG tablet Take 1 tablet (600 mg total) by mouth every 6 (six) hours as needed 06/25/23     norethindrone  (AYGESTIN ) 5 MG tablet Take 2 tablets (10 mg total) by mouth daily. 11/08/23     tretinoin  (RETIN-A ) 0.1 % cream Apply pea size amount to face nightly followed by moisturizer 05/08/21     VITAMIN D  PO Take by mouth.    [provider]      Allergies    Other and Penicillins    Review of Systems   Review of Systems  Physical Exam Updated Vital Signs BP 120/78   Pulse 97   Temp  98.5 F (36.9 C) (Oral)   Resp 16   Ht 5' (1.524 m)   Wt 81.6 kg   SpO2 99%   BMI 35.15 kg/m  Physical Exam Vitals and nursing note reviewed.  Constitutional:      General: She is not in acute distress.    Appearance: She is well-developed.  HENT:     Head: Normocephalic and atraumatic.  Eyes:     Conjunctiva/sclera: Conjunctivae normal.  Cardiovascular:     Rate and Rhythm: Normal rate and regular rhythm.     Heart sounds: Normal heart sounds. No murmur heard. Pulmonary:     Effort: Pulmonary effort is normal. No respiratory distress.     Breath sounds: Normal breath sounds.  Abdominal:     Palpations: Abdomen is soft.     Tenderness: There is abdominal tenderness in the suprapubic area. There is no guarding or rebound. Negative signs include Murphy's sign and Rovsing's sign.  Musculoskeletal:        General: No swelling.     Cervical back: Neck supple.  Skin:    General: Skin is warm and dry.     Capillary Refill: Capillary refill takes less than 2 seconds.  Neurological:     Mental Status: She is alert.  Psychiatric:        Mood and Affect: Mood normal.     ED Results / Procedures / Treatments   Labs (all labs ordered are listed, but only abnormal results are displayed) Labs Reviewed  COMPREHENSIVE METABOLIC PANEL - Abnormal; Notable for the following components:      Result Value   Calcium 8.7 (*)    All other components within normal limits  URINALYSIS, ROUTINE W REFLEX MICROSCOPIC - Abnormal; Notable for the following components:   Hgb urine dipstick MODERATE (*)    Leukocytes,Ua TRACE (*)    All other components within normal limits  URINALYSIS, MICROSCOPIC (REFLEX) - Abnormal; Notable for the following components:   Bacteria, UA MANY (*)    All other components within normal limits  LIPASE, BLOOD  CBC  PREGNANCY, URINE    EKG None  Radiology US  PELVIC COMPLETE W TRANSVAGINAL AND TORSION R/O Result Date: 12/25/2023 CLINICAL DATA:  Abnormal CT.   Pelvic mass. EXAM: TRANSABDOMINAL AND TRANSVAGINAL ULTRASOUND OF PELVIS DOPPLER ULTRASOUND OF OVARIES TECHNIQUE: Both transabdominal and transvaginal ultrasound examinations of the pelvis were performed. Transabdominal technique was performed for global imaging of the pelvis including uterus, ovaries, adnexal regions, and pelvic cul-de-sac. It was necessary to proceed with endovaginal exam following the transabdominal exam to visualize the uterus, endometrium, ovaries and adnexa. Color and duplex Doppler ultrasound was utilized to evaluate blood flow to the ovaries. COMPARISON:  CT earlier today FINDINGS: Uterus Measurements: 14 x 4.9 x 7.4 cm = volume: 265 mL. Multiple fibroids. 3.1 cm posterior body fibroid. 2.7 cm right fundal fibroid. Superior to the uterus is a heterogeneous mass measuring 10.1 x 7.7 x 6.4 cm, corresponding to the mass seen on CT. This appears to be connected to the uterus by a stalk suggesting pedunculated degenerating fibroid. Endometrium Thickness: 9 mm in thickness.  No focal abnormality visualized. Right ovary Measurements: 2.7 x 1.3 x 2.1 cm = volume: 3.9 mL. Normal appearance/no adnexal mass. Left ovary Measurements: 2.7 x 1.3 x 1.6 cm = volume: 2.9 mL. Normal appearance/no adnexal mass. Pulsed Doppler evaluation of both ovaries demonstrates normal low-resistance arterial and venous waveforms. Other findings Trace free fluid in the pelvis. IMPRESSION: Enlarged fibroid uterus. Large heterogeneous mass superior to the uterus appears to be connected to the fundus via a stalk suggesting a pedunculated degenerating fibroid. Recommend gynecologic consultation. This could be confirmed and further evaluated with elective non emergent MRI if felt clinically indicated. Electronically Signed   By: Franky Crease M.D.   On: 12/25/2023 19:34   CT ABDOMEN PELVIS W CONTRAST Result Date: 12/25/2023 CLINICAL DATA:  Lower abdominal pain for 3 days. EXAM: CT ABDOMEN AND PELVIS WITH CONTRAST TECHNIQUE:  Multidetector CT imaging of the abdomen and pelvis was performed using the standard protocol following bolus administration of intravenous contrast. RADIATION DOSE REDUCTION: This exam was performed according to the departmental dose-optimization program which includes automated exposure control, adjustment of the mA and/or kV according to patient size and/or use of iterative reconstruction technique. CONTRAST:  80mL OMNIPAQUE  IOHEXOL  300 MG/ML  SOLN COMPARISON:  Pelvic ultrasound 2013. FINDINGS: Lower chest: Breathing motion lung bases. No pleural effusion. Few tiny nodules identified including juxtapleural focus right lower lobe laterally measuring 3 mm. If patient is low risk for malignancy, no routine follow-up imaging is recommended. If patient is high risk for malignancy, a non-contrast chest CT at 12 months is optional.This recommendation follows the consensus statement: Guidelines for Management of Incidental Pulmonary Nodules Detected on CT Images: From  the Fleischner Society 2017; Radiology 2017; [phone]. Hepatobiliary: Tiny low-attenuation lesion in the right hepatic lobe on series 301, image 27, too small to completely characterize but likely a benign cystic lesion and no specific imaging follow-up. Similar focus adjacent to the gallbladder on image 39. Gallbladder itself is nondilated. Patent portal vein. Pancreas: Unremarkable. No pancreatic ductal dilatation or surrounding inflammatory changes. Spleen: Normal in size without focal abnormality. Adrenals/Urinary Tract: Adrenal glands are unremarkable. Kidneys are normal, without renal calculi, focal lesion, or hydronephrosis. Bladder is unremarkable. Stomach/Bowel: Stomach is within normal limits. Appendix appears normal. No evidence of bowel wall thickening, distention, or inflammatory changes. Normal appendix. Vascular/Lymphatic: No significant vascular findings are present. No enlarged abdominal or pelvic lymph nodes. Few prominent  retroperitoneal nodes seen, nonpathologic by size criteria. Reproductive: The uterus is enlarged with a particular heterogeneous mass along the fundus with an exophytic component measuring 8.6 x 6.3 cm. Some central areas of low-density could be necrosis. This also moderate adjacent stranding and some trace fluid in the pelvis. Other: No free intra-air. Musculoskeletal: No acute or significant osseous findings. IMPRESSION: No bowel obstruction, free air.  Normal appendix. There is lobular mass along the uterus with central necrosis. There is surrounding stranding and some fluid as well. This could be a fibroid with central necrosis however in the absence of more recent prior an aggressive lesion is not excluded including, although unlikely, a malignancy on the basis of imaging alone and would recommend further workup. Please correlate with the recent prior to assess for interval growth of the lesion. Ultrasound and MRI with and without contrast may be of some benefit when able. Electronically Signed   By: Ranell Bring M.D.   On: 12/25/2023 17:26    Procedures Procedures    Medications Ordered in ED Medications  iohexol  (OMNIPAQUE ) 300 MG/ML solution 80 mL (80 mLs Intravenous Contrast Given 12/25/23 1647)  ketorolac  (TORADOL ) 15 MG/ML injection 15 mg (15 mg Intravenous Given 12/25/23 1819)    ED Course/ Medical Decision Making/ A&P                                 Medical Decision Making Amount and/or Complexity of Data Reviewed Labs: ordered. Radiology: ordered.  Risk Prescription drug management.   Kimberly Cooper is here with abdominal pain.  Unremarkable vitals.  No fever.  Differential diagnosis appendicitis versus UTI versus bowel obstruction versus pelvic related process.  She is not having any discharge or bleeding.  She has a history of fibroids.  But she has not been having any vaginal bleeding or discharge.  No concern for STDs.  We will get a CBC CMP lipase CT scan abdomen pelvis  check urinalysis.  Pregnancy test already negative.  Per my review and interpretation of labs no significant anemia or electrolyte abnormality kidney injury.  Urinalysis does not appear consistent with infection.  CT scan was overall unremarkable but did show may be a fibroid/may be malignant process.  We got a pelvic ultrasound that showed no ovarian torsion or cyst.  But did show an enlarged uterine fibroid.  There is a large heterogeneous mass superior to the uterus which appears to be connected to the fundus and does appear to be a pedunculated degenerating fibroid.  Overall I recommend close follow-up with her OB/GYN to further evaluate this.  Will write her for oxycodone  for breakthrough pain.  More reassurance that this is likely benign process but important  that she get this evaluated to make sure is not cancerous process.  Recommend Tylenol  ibuprofen  as well for pain and Roxicodone  for breakthrough pain.  Discharged in good condition.  This chart was dictated using voice recognition software.  Despite best efforts to proofread,  errors can occur which can change the documentation meaning.         Final Clinical Impression(s) / ED Diagnoses Final diagnoses:  Generalized abdominal pain  Uterine leiomyoma, unspecified location    Rx / DC Orders ED Discharge Orders          Ordered    oxyCODONE  (ROXICODONE ) 5 MG immediate release tablet  Every 6 hours PRN        12/25/23 1946              Ruthe Cornet, DO 12/25/23 1948

## 2023-12-25 NOTE — ED Triage Notes (Signed)
 The patient is having lower right abd pain for three days. No vaginal bleeding. No V/D or fever. She stated she is having pain with urination.

## 2023-12-25 NOTE — ED Notes (Signed)
 Reviewed D/C information with the patient, pt verbalized understanding. No additional concerns at this time.

## 2023-12-25 NOTE — Discharge Instructions (Addendum)
 Follow-up with your OB/GYN.  Take Roxicodone  for breakthrough pain but take ibuprofen  and Tylenol  beforehand.  Roxicodone  is sedating so do not mix with alcohol drugs including dangerous activities including driving.  We do think that this is a fibroid but make sure you follow-up with OB/GYN to make sure this not a cancerous process.

## 2023-12-25 NOTE — ED Notes (Signed)
 Patient transported to Ultrasound

## 2023-12-28 ENCOUNTER — Other Ambulatory Visit (HOSPITAL_COMMUNITY): Payer: Self-pay

## 2023-12-28 DIAGNOSIS — R102 Pelvic and perineal pain: Secondary | ICD-10-CM | POA: Diagnosis not present

## 2023-12-28 DIAGNOSIS — D259 Leiomyoma of uterus, unspecified: Secondary | ICD-10-CM | POA: Diagnosis not present

## 2023-12-28 MED ORDER — OXYCODONE HCL 5 MG PO TABS
5.0000 mg | ORAL_TABLET | Freq: Four times a day (QID) | ORAL | 0 refills | Status: DC | PRN
  Filled 2023-12-28: qty 40, 10d supply, fill #0

## 2023-12-28 MED ORDER — IBUPROFEN 800 MG PO TABS
800.0000 mg | ORAL_TABLET | Freq: Three times a day (TID) | ORAL | 1 refills | Status: DC | PRN
  Filled 2023-12-28: qty 40, 14d supply, fill #0
  Filled 2024-02-14: qty 40, 14d supply, fill #1

## 2024-01-13 ENCOUNTER — Other Ambulatory Visit: Payer: Self-pay | Admitting: Obstetrics and Gynecology

## 2024-01-25 ENCOUNTER — Telehealth: Payer: 59 | Admitting: Physician Assistant

## 2024-01-25 DIAGNOSIS — J029 Acute pharyngitis, unspecified: Secondary | ICD-10-CM

## 2024-01-26 ENCOUNTER — Ambulatory Visit: Payer: 59 | Admitting: Physician Assistant

## 2024-01-26 ENCOUNTER — Ambulatory Visit: Payer: Self-pay | Admitting: Physician Assistant

## 2024-01-26 ENCOUNTER — Telehealth: Payer: 59 | Admitting: Physician Assistant

## 2024-01-26 DIAGNOSIS — J020 Streptococcal pharyngitis: Secondary | ICD-10-CM

## 2024-01-26 MED ORDER — AZITHROMYCIN 250 MG PO TABS: ORAL_TABLET | ORAL | 0 refills | Status: DC

## 2024-01-26 NOTE — Telephone Encounter (Signed)
 Copied from CRM [id number]. Topic: Clinical - Red Word Triage >> Jan 26, 2024  8:36 AM Chantha C wrote: Red Word that prompted transfer to Nurse Triage: Patient tested positive for strep throat at [employer] where to works, symptoms are sore throat very painful to swallow, body aches and low grade fever. Please advise [phone]. Would like antibiotics called into [facility] [address], [place] - [address] [place] [address] Phone: [phone] Fax: [phone]  Chief Complaint: sore throat; tested at home for strep and positive.  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  Apt made for this afternoon but would like office to call her in an antibiotic.  PCP office updated and to follow up with patient.   Reason for Disposition  [1] Positive throat culture or rapid strep test (according to lab, PCP, caller, etc.) AND [2] NO  standing order to call in prescription for antibiotic  Answer Assessment - Initial Assessment Questions 1. ONSET: When did the throat start hurting? (Hours or days ago)      Monday started, tested self for strep throat. 2. SEVERITY: How bad is the sore throat? (Scale 1-10; mild, moderate or severe)   - MILD (1-3):  Doesn't interfere with eating or normal activities.   - MODERATE (4-7): Interferes with eating some solids and normal activities.   - SEVERE (8-10):  Excruciating pain, interferes with most normal activities.   - SEVERE WITH DYSPHAGIA (10): Can't swallow liquids, drooling.     4-5/10 3. STREP EXPOSURE: Has there been any exposure to strep within the  past week? If Yes, ask: What type of contact occurred?      yes 4.  VIRAL SYMPTOMS: Are there any symptoms of a cold, such as a runny nose, cough, hoarse voice or red eyes?      Cough and hoarse 5. FEVER: Do you have a fever? If Yes, ask: What is your temperature, how was it measured, and when did it start?     Low grade 6. PUS ON THE TONSILS: Is there pus on the tonsils in the back of your throat?     denies 7. OTHER SYMPTOMS: Do you have any other symptoms? (e.g., difficulty breathing, headache, rash)     Body aches 8. PREGNANCY: Is there any chance you are pregnant? When was your last menstrual period?     na  Protocols used: Sore Throat-A-AH

## 2024-01-26 NOTE — Telephone Encounter (Signed)
 Patient is scheduled for this afternoon.

## 2024-01-26 NOTE — Progress Notes (Signed)
 Thank you for the details you included in the comment boxes. Those details are very helpful in determining the best course of treatment for you and help us  to provide the best care.Because we need more information from you to determine your best course of treatment, we recommend that you schedule a Virtual Urgent Care video visit in order for the provider to better assess what is going on.  The provider will be able to give you a more accurate diagnosis and treatment plan if we can more freely discuss your symptoms and with the addition of a virtual examination.   If you change your visit to a video visit, we will bill your insurance (similar to an office visit) and you will not be charged for this e-Visit. You will be able to stay at home and speak with the first available [facility] advanced practice provider. The link to do a video visit is in the drop down Menu tab of your Welcome screen in MyChart.

## 2024-01-26 NOTE — Progress Notes (Signed)
 Virtual Visit Consent   Kimberly Cooper, you are scheduled for a virtual visit with a [facility] provider today. Just as with appointments in the office, your consent must be obtained to participate. Your consent will be active for this visit and any virtual visit you may have with one of our providers in the next 365 days. If you have a MyChart account, a copy of this consent can be sent to you electronically.  As this is a virtual visit, video technology does not allow for your provider to perform a traditional examination. This may limit your provider's ability to fully assess your condition. If your provider identifies any concerns that need to be evaluated in person or the need to arrange testing (such as labs, EKG, etc.), we will make arrangements to do so. Although advances in technology are sophisticated, we cannot ensure that it will always work on either your end or our end. If the connection with a video visit is poor, the visit may have to be switched to a telephone visit. With either a video or telephone visit, we are not always able to ensure that we have a secure connection.  By engaging in this virtual visit, you consent to the provision of healthcare and authorize for your insurance to be billed (if applicable) for the services provided during this visit. Depending on your insurance coverage, you may receive a charge related to this service.  I need to obtain your verbal consent now. Are you willing to proceed with your visit today? Kimberly Cooper has provided verbal consent on 01/26/2024 for a virtual visit (video or telephone). [name], [place]  Date: 01/26/2024 3:17 PM  Virtual Visit via Video Note   I, [name], connected with  Kimberly Cooper  ([id number], 03-14-1991) on 01/26/24 at  3:15 PM EST by a video-enabled telemedicine application and verified that I am speaking with the correct person using two identifiers.  Location: Patient: Virtual Visit Location  Patient: Home Provider: Virtual Visit Location Provider: Home Office   I discussed the limitations of evaluation and management by telemedicine and the availability of in person appointments. The patient expressed understanding and agreed to proceed.    History of Present Illness: Kimberly Cooper is a 33 y.o. who identifies as a female who was assigned female at birth, and is being seen today for strep throat. Sore throat starting Monday, mild but progressively worsening since onset. Notes aches and fatigue. 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 Has been taking Tylenol  which helps with the pain.  HPI: HPI  Problems:  Patient Active Problem List   Diagnosis Date Noted   Avitaminosis D 09/13/2023   PCB (post coital bleeding) 01/05/2020   Pelvic pain in female 07/17/2016   Dyspareunia, female 07/17/2016   Dysmenorrhea 08/23/2012    Allergies:  Allergies  Allergen Reactions   Other Hives    All Raw Nuts: Swelling   Penicillins Hives   Medications:  Current Outpatient Medications:    azithromycin  (ZITHROMAX ) 250 MG tablet, Take 2 tablets on Day 1. Then take 1 tablet daily, Disp: 6 tablet, Rfl: 0   Dapsone  5 % topical gel, Apply 1 application topically to the face every morning., Disp: 60 g, Rfl: 2   EPINEPHrine  0.3 mg/0.3 mL IJ SOAJ injection, Inject 0.3 mg into the muscle as needed for anaphylaxis., Disp: 2 each, Rfl: 0   fluocinonide  cream (LIDEX ) 0.05 %,  Apply 1 Application topically 2 (two) times daily., Disp: 60 g, Rfl: 0   hydroquinone  4 % cream, Apply a small amount to affected area twice a day as needed until desired color is achieved, Disp: 56.7 g, Rfl: 1   ibuprofen  (ADVIL ) 600 MG tablet, Take 1 tablet (600 mg total) by mouth every 6 (six) hours as needed, Disp: 30 tablet, Rfl: 1   ibuprofen  (ADVIL ) 800 MG tablet, Take 1 tablet (800 mg total) by mouth every 8 (eight) hours as needed., Disp: 40 tablet, Rfl: 1    norethindrone  (AYGESTIN ) 5 MG tablet, Take 2 tablets (10 mg total) by mouth daily., Disp: 180 tablet, Rfl: 3   oxyCODONE  (OXY IR/ROXICODONE ) 5 MG immediate release tablet, Take 1 tablet (5 mg total) by mouth every 6 (six) hours as needed., Disp: 40 tablet, Rfl: 0   oxyCODONE  (ROXICODONE ) 5 MG immediate release tablet, Take 1 tablet (5 mg total) by mouth every 6 (six) hours as needed for up to 10 doses., Disp: 10 tablet, Rfl: 0   tretinoin  (RETIN-A ) 0.1 % cream, Apply pea size amount to face nightly followed by moisturizer, Disp: 45 g, Rfl: 2   VITAMIN D  PO, Take by mouth., Disp: , Rfl:   Observations/Objective: Patient is well-developed, well-nourished in no acute distress.  Resting comfortably  at home.  Head is normocephalic, atraumatic.  No labored breathing. Speech is clear and coherent with logical content.  Patient is alert and oriented at baseline.  Posterior oropharyngeal erythema noted. Uvula midline and without lesion. Tonsillar swelling bilaterally. Unable to appreciate any exudate.   Assessment and Plan: 1. Strep throat (Primary) - azithromycin  (ZITHROMAX ) 250 MG tablet; Take 2 tablets on Day 1. Then take 1 tablet daily  Dispense: 6 tablet; Refill: 0  Known exposure. Typical symptoms and positive POC testing. Giving penicillin allergy will Rx Azithromycin . Supportive measures and OTC medications reviewed. Work note declined.  Follow Up Instructions: I discussed the assessment and treatment plan with the patient. The patient was provided an opportunity to ask questions and all were answered. The patient agreed with the plan and demonstrated an understanding of the instructions.  A copy of instructions were sent to the patient via MyChart unless otherwise noted below.   The patient was advised to call back or seek an in-person evaluation if the symptoms worsen or if the condition fails to improve as anticipated.    [name], PA-C

## 2024-01-26 NOTE — Progress Notes (Deleted)
      Established patient visit   Patient: Kimberly Cooper   DOB: 1991/07/16   33 y.o. Female  MRN: [id number] Visit Date: 01/26/2024  Today's healthcare provider: Manuelita Flatness, PA-C   No chief complaint on file.  Subjective     ***  Medications: Outpatient Medications Prior to Visit  Medication Sig   Dapsone  5 % topical gel Apply 1 application topically to the face every morning.   EPINEPHrine  0.3 mg/0.3 mL IJ SOAJ injection Inject 0.3 mg into the muscle as needed for anaphylaxis.   fluocinonide  cream (LIDEX ) 0.05 % Apply 1 Application topically 2 (two) times daily.   hydroquinone  4 % cream Apply a small amount to affected area twice a day as needed until desired color is achieved   ibuprofen  (ADVIL ) 600 MG tablet Take 1 tablet (600 mg total) by mouth every 6 (six) hours as needed   ibuprofen  (ADVIL ) 800 MG tablet Take 1 tablet (800 mg total) by mouth every 8 (eight) hours as needed.   norethindrone  (AYGESTIN ) 5 MG tablet Take 2 tablets (10 mg total) by mouth daily.   oxyCODONE  (OXY IR/ROXICODONE ) 5 MG immediate release tablet Take 1 tablet (5 mg total) by mouth every 6 (six) hours as needed.   oxyCODONE  (ROXICODONE ) 5 MG immediate release tablet Take 1 tablet (5 mg total) by mouth every 6 (six) hours as needed for up to 10 doses.   tretinoin  (RETIN-A ) 0.1 % cream Apply pea size amount to face nightly followed by moisturizer   VITAMIN D  PO Take by mouth.   No facility-administered medications prior to visit.    Review of Systems {Insert previous labs (optional):23779} {See past labs  Heme  Chem  Endocrine  Serology  Results Review (optional):1}   Objective    There were no vitals taken for this visit. {Insert last BP/Wt (optional):23777}{See vitals history (optional):1}  Physical Exam  ***  No results found for any visits on 01/26/24.  Assessment & Plan    There are no diagnoses linked to this encounter.  ***  No follow-ups on file.       Manuelita Flatness, PA-C  [facility] at [facility] [phone] (phone) [phone] (fax)  [facility]

## 2024-01-26 NOTE — Patient Instructions (Signed)
 Kimberly Cooper, thank you for joining [name], PA-C for today's virtual visit.  While this provider is not your primary care provider (PCP), if your PCP is located in our provider database this encounter information will be shared with them immediately following your visit.   A [facility] MyChart account gives you access to today's visit and all your visits, tests, and labs performed at [facility]  click here if you don't have a [facility] MyChart account or go to [email or web address]  Consent: (Patient) Kimberly Cooper provided verbal consent for this virtual visit at the beginning of the encounter.  Current Medications:  Current Outpatient Medications:    Dapsone  5 % topical gel, Apply 1 application topically to the face every morning., Disp: 60 g, Rfl: 2   EPINEPHrine  0.3 mg/0.3 mL IJ SOAJ injection, Inject 0.3 mg into the muscle as needed for anaphylaxis., Disp: 2 each, Rfl: 0   fluocinonide  cream (LIDEX ) 0.05 %, Apply 1 Application topically 2 (two) times daily., Disp: 60 g, Rfl: 0   hydroquinone  4 % cream, Apply a small amount to affected area twice a day as needed until desired color is achieved, Disp: 56.7 g, Rfl: 1   ibuprofen  (ADVIL ) 600 MG tablet, Take 1 tablet (600 mg total) by mouth every 6 (six) hours as needed, Disp: 30 tablet, Rfl: 1   ibuprofen  (ADVIL ) 800 MG tablet, Take 1 tablet (800 mg total) by mouth every 8 (eight) hours as needed., Disp: 40 tablet, Rfl: 1   norethindrone  (AYGESTIN ) 5 MG tablet, Take 2 tablets (10 mg total) by mouth daily., Disp: 180 tablet, Rfl: 3   oxyCODONE  (OXY IR/ROXICODONE ) 5 MG immediate release tablet, Take 1 tablet (5 mg total) by mouth every 6 (six) hours as needed., Disp: 40 tablet, Rfl: 0   oxyCODONE  (ROXICODONE ) 5 MG immediate release tablet, Take 1 tablet (5 mg total) by mouth every 6 (six) hours as needed for up to 10 doses., Disp: 10 tablet, Rfl: 0   tretinoin  (RETIN-A ) 0.1 % cream, Apply pea size amount to  face nightly followed by moisturizer, Disp: 45 g, Rfl: 2   VITAMIN D  PO, Take by mouth., Disp: , Rfl:    Medications ordered in this encounter:  No orders of the defined types were placed in this encounter.    *If you need refills on other medications prior to your next appointment, please contact your pharmacy*  Follow-Up: Call back or seek an in-person evaluation if the symptoms worsen or if the condition fails to improve as anticipated.  [facility] [phone]  Other Instructions Strep Throat, Adult Strep throat is an infection of the throat. It is caused by germs (bacteria). Strep throat is common during the cold months of the year. It mostly affects children who are 89-34 years old. However, people of all ages can get it at any time of the year. This infection spreads from person to person through coughing, sneezing, or having close contact. What are the causes? This condition is caused by the Streptococcus pyogenes germ. What increases the risk? You care for young children. Children are more likely to get strep throat and may spread it to others. You go to crowded places. Germs can spread easily in such places. You kiss or touch someone who has strep throat. What are the signs or symptoms? Fever or chills. Redness, swelling, or pain in the tonsils or throat. Pain or trouble when swallowing. White or yellow spots on the tonsils or  throat. Tender glands in the neck and under the jaw. Bad breath. Red rash all over the body. This is rare. How is this treated? Medicines that kill germs (antibiotics). Medicines that treat pain or fever. These include: Ibuprofen  or acetaminophen . Aspirin, only for people who are over the age of 57. Cough drops. Throat sprays. Follow these instructions at home: Medicines  Take over-the-counter and prescription medicines only as told by your doctor. Take your antibiotic medicine as told by your doctor. Do not stop taking the  antibiotic even if you start to feel better. Eating and drinking  If you have trouble swallowing, eat soft foods until your throat feels better. Drink enough fluid to keep your pee (urine) pale yellow. To help with pain, you may have: Warm fluids, such as soup and tea. Cold fluids, such as frozen desserts or popsicles. General instructions Rinse your mouth (gargle) with a salt-water mixture 3-4 times a day or as needed. To make a salt-water mixture, dissolve -1 tsp (3-6 g) of salt in 1 cup (237 mL) of warm water. Rest as much as you can. Stay home from work or school until you have been taking antibiotics for 24 hours. Do not smoke or use any products that contain nicotine or tobacco. If you need help quitting, ask your doctor. Keep all follow-up visits. How is this prevented?  Do not share food, drinking cups, or personal items. They can cause the germs to spread. Wash your hands well with soap and water. Make sure that all people in your house wash their hands well. Have family members tested if they have a fever or a sore throat. They may need an antibiotic if they have strep throat. Contact a doctor if: You have swelling in your neck that keeps getting bigger. You get a rash, cough, or earache. You cough up a thick fluid that is green, yellow-brown, or bloody. You have pain that does not get better with medicine. Your symptoms get worse instead of getting better. You have a fever. Get help right away if: You vomit. You have a very bad headache. Your neck hurts or feels stiff. You have chest pain or are short of breath. You have drooling, very bad throat pain, or changes in your voice. Your neck is swollen, or the skin gets red and tender. Your mouth is dry, or you are peeing less than normal. You keep feeling more tired or have trouble waking up. Your joints are red or painful. These symptoms may be an emergency. Do not wait to see if the symptoms will go away. Get help right  away. Call your local emergency services (911 in the [place] Summary Strep throat is an infection of the throat. It is caused by germs (bacteria). This infection can spread from person to person through coughing, sneezing, or having close contact. Take your medicines, including antibiotics, as told by your doctor. Do not stop taking the antibiotic even if you start to feel better. To prevent the spread of germs, wash your hands well with soap and water. Have others do the same. Do not share food, drinking cups, or personal items. Get help right away if you have a bad headache, chest pain, shortness of breath, a stiff or painful neck, or you vomit. This information is not intended to replace advice given to you by your health care provider. Make sure you discuss any questions you have with your health care provider. Document Revised: 04/01/2021 Document Reviewed: 04/01/2021 Elsevier Patient Education  2024  [employer]vier [employer]   If you have been instructed to have an in-person evaluation today at a local Urgent Care facility, please use the link below. It will take you to a list of all of our available [facility], including address, phone number and hours of operation. Please do not delay care.  [facility]  If you or a family member do not have a primary care provider, use the link below to schedule a visit and establish care. When you choose a [facility] primary care physician or advanced practice provider, you gain a long-term partner in health. Find a Primary Care Provider  Learn more about [facility]'s in-office and virtual care options: [facility] - Get Care Now

## 2024-02-07 ENCOUNTER — Other Ambulatory Visit (HOSPITAL_COMMUNITY): Payer: Self-pay

## 2024-02-14 ENCOUNTER — Other Ambulatory Visit (HOSPITAL_COMMUNITY): Payer: Self-pay

## 2024-03-03 ENCOUNTER — Encounter (HOSPITAL_COMMUNITY): Payer: Self-pay | Admitting: Obstetrics and Gynecology

## 2024-03-06 ENCOUNTER — Encounter (HOSPITAL_COMMUNITY): Payer: Self-pay | Admitting: Obstetrics and Gynecology

## 2024-03-06 NOTE — Pre-Procedure Instructions (Addendum)
 Surgical Instructions   Your procedure is scheduled on :  Monday,  03-13-2024. Report to [facility] Main Entrance "A" at 7:45 AM A.M., then check in with the Admitting office. Any questions or running late day of surgery: call [phone]  Questions prior to your surgery date: call [phone] or [phone], Monday-Friday, 8am-4pm. If you experience any cold or flu symptoms such as cough, fever, chills, shortness of breath, etc. between now and your scheduled surgery, please notify your surgeon office.    Remember:  Do not eat any food and do not drink any liquid after midnight the night before your surgery.  This includes no water,  candy,  gum,  and  mints.    Take these medicines the morning of surgery with A SIP OF WATER : Norethinedrone   May take these medicines IF NEEDED:  none    One week prior to surgery, STOP taking any Aspirin (unless otherwise instructed by your surgeon) Aleve, Naproxen, Ibuprofen, Motrin, Advil, Goody's, BC's, all herbal medications, fish oil, and non-prescription vitamins.                     Do NOT Smoke (Tobacco/Vaping) for 24 hours prior to your procedure.  If you use a CPAP at night, you may bring your mask/headgear for your overnight stay.   You will be asked to remove any contacts, glasses, piercing's, hearing aid's, dentures/partials prior to surgery. Please bring cases for these items if needed.    Patients discharged the day of surgery will not be allowed to drive home, and someone needs to stay with them for 24 hours.  SURGICAL WAITING ROOM VISITATION Patients may have no more than 2 support people in the waiting area - these visitors may rotate.   Pre-op nurse will coordinate an appropriate time for 1 ADULT support person, who may not rotate, to accompany patient in pre-op.  Children under the age of 25 must have an adult with them who is not the patient and must remain in the main waiting area with an adult.  If the patient needs  to stay at the hospital during part of their recovery, the visitor guidelines for inpatient rooms apply.  Please refer to the [facility] website for the visitor guidelines for any additional information.   If you received a COVID test during your pre-op visit  it is requested that you wear a mask when out in public, stay away from anyone that may not be feeling well and notify your surgeon if you develop symptoms. If you have been in contact with anyone that has tested positive in the last 10 days please notify you surgeon.      Pre-operative CHG Bathing Instructions   You can play a key role in reducing the risk of infection after surgery. Your skin needs to be as free of germs as possible. You can reduce the number of germs on your skin by washing with CHG (chlorhexidine gluconate) soap before surgery. CHG is an antiseptic soap that kills germs and continues to kill germs even after washing.   DO NOT use if you have an allergy to chlorhexidine/CHG or antibacterial soaps. If your skin becomes reddened or irritated, stop using the CHG and notify Pre-Op nurse day of surgery.  If you have any skin irritation or problems with the surgical soap (CHG), do not use.  Please get a bar of gold dial soap or any antibacterial soap and shower following the instructions below.  TAKE A SHOWER THE NIGHT BEFORE SURGERY AND THE DAY OF SURGERY    Please keep in mind the following:  DO NOT shave, including legs and underarms, 48 hours prior to surgery.   You may shave your face before/day of surgery.  Place clean sheets on your bed the night before surgery Use a clean washcloth (not used since being washed) for each shower. DO NOT sleep with pet's night before surgery.  CHG Shower Instructions:  Wash your face and private area with normal soap. If you choose to wash your hair, wash first with your normal shampoo.  After you use shampoo/soap, rinse your hair and body thoroughly to remove  shampoo/soap residue.  Turn the water OFF and apply half the bottle of CHG soap to a CLEAN washcloth.  Apply CHG soap ONLY FROM YOUR NECK DOWN TO YOUR TOES (washing for 3-5 minutes)  DO NOT use CHG soap on face, private areas, open wounds, or sores.  Pay special attention to the area where your surgery is being performed.  If you are having back surgery, having someone wash your back for you may be helpful. Wait 2 minutes after CHG soap is applied, then you may rinse off the CHG soap.  Pat dry with a clean towel  Put on clean pajamas    Additional instructions for the day of surgery: DO NOT APPLY any lotions, oils, deodorants (may use underarm deodorant),  Cologne/ perfumes, makeup Do not wear jewelry /  piercing's/  permanent jewelry must be removed prior to arrival. Do not wear nail polish, gel polish, artificial nails, or any other type of covering on natural nails (fingers and toes) Do not bring valuables to the hospital. [facility] is not responsible for valuables/personal belongings. Put on clean/comfortable clothes.  Please brush your teeth.  Ask your nurse before applying any prescription medications to the skin.

## 2024-03-06 NOTE — Progress Notes (Addendum)
 Spoke w/ via phone for pre-op interview--- pt Lab needs dos----   urine preg      Lab results------ lab appt 03-08-2024 getting CBC/ T&S COVID test -----patient states asymptomatic no test needed Arrive at ------- 0745 on 03-13-2024 NPO after MN  Pre-Surgery Ensure or G2: n/a  Med rec completed Medications to take morning of surgery ----- w/ sips of water norethinedrone Diabetic medication ----- n/a  GLP1 agonist last dose:n/a GLP1 instructions:  Patient instructed no nail polish to be worn day of surgery Patient instructed to bring photo id and insurance card day of surgery Patient aware to have Driver (ride ) / caregiver    for 24 hours after surgery - mother, tracey Patient Special Instructions ----- will pick up bag w CHG and written instructions at lab appt Pre-Op special Instructions ----- sent inbox message in epic  to dr Su Hilt to second sign pre-op orders since pt has lab appt on 03-08-2024  Patient verbalized understanding of instructions that were given at this phone interview. Patient denies chest pain, sob, fever, cough at the interview.

## 2024-03-08 ENCOUNTER — Encounter (HOSPITAL_COMMUNITY)
Admission: RE | Admit: 2024-03-08 | Discharge: 2024-03-08 | Disposition: A | Discharge status: Home / Self Care | Source: Ambulatory Visit | Attending: Obstetrics and Gynecology | Admitting: Obstetrics and Gynecology

## 2024-03-08 DIAGNOSIS — Z01818 Encounter for other preprocedural examination: Secondary | ICD-10-CM

## 2024-03-08 DIAGNOSIS — Z01812 Encounter for preprocedural laboratory examination: Secondary | ICD-10-CM | POA: Diagnosis not present

## 2024-03-08 LAB — CBC
HCT: 39.7 % (ref 36.0–46.0)
Hemoglobin: 13 g/dL (ref 12.0–15.0)
MCH: 29.5 pg (ref 26.0–34.0)
MCHC: 32.7 g/dL (ref 30.0–36.0)
MCV: 90 fL (ref 80.0–100.0)
Platelets: 338 10*3/uL (ref 150–400)
RBC: 4.41 MIL/uL (ref 3.87–5.11)
RDW: 13.2 % (ref 11.5–15.5)
WBC: 10.8 10*3/uL — ABNORMAL HIGH (ref 4.0–10.5)
nRBC: 0 % (ref 0.0–0.2)

## 2024-03-08 LAB — TYPE AND SCREEN
ABO/RH(D): O POS
Antibody Screen: NEGATIVE

## 2024-03-09 NOTE — Progress Notes (Signed)
 Spoke with patient; informed her of new arrival time at [facility]-10:30 am for surgery on 03/13/24. Told her she could have clear liquids until 10am. No food after midnight on Sunday. Clear liquids defined in pre op call check list and read to patient. Informed that all liquids stop at 10 am and emphasized that she is NOT to have any milk or milk products or juice with pulp such as orange juice,grapefruit juice,tomato juice,or V8. Pt verbalized understanding of these instructions and acknowledged receiving previous preop instructions from Harless Litten RN. Patient had no further questions.

## 2024-03-13 ENCOUNTER — Inpatient Hospital Stay (HOSPITAL_COMMUNITY)
Admission: RE | Admit: 2024-03-13 | Discharge: 2024-03-15 | DRG: 743 | Disposition: A | Discharge status: Home / Self Care | Payer: 59 | Attending: Obstetrics and Gynecology | Admitting: Obstetrics and Gynecology

## 2024-03-13 ENCOUNTER — Other Ambulatory Visit: Payer: Self-pay

## 2024-03-13 ENCOUNTER — Encounter (HOSPITAL_COMMUNITY)
Admission: RE | Disposition: A | Discharge status: Home / Self Care | Payer: Self-pay | Source: Home / Self Care | Attending: Obstetrics and Gynecology

## 2024-03-13 ENCOUNTER — Ambulatory Visit (HOSPITAL_COMMUNITY)

## 2024-03-13 ENCOUNTER — Encounter (HOSPITAL_COMMUNITY): Payer: Self-pay | Admitting: Obstetrics and Gynecology

## 2024-03-13 DIAGNOSIS — D259 Leiomyoma of uterus, unspecified: Principal | ICD-10-CM | POA: Diagnosis present

## 2024-03-13 DIAGNOSIS — Z880 Allergy status to penicillin: Secondary | ICD-10-CM

## 2024-03-13 DIAGNOSIS — Z01818 Encounter for other preprocedural examination: Principal | ICD-10-CM

## 2024-03-13 DIAGNOSIS — Z9889 Other specified postprocedural states: Secondary | ICD-10-CM

## 2024-03-13 DIAGNOSIS — K219 Gastro-esophageal reflux disease without esophagitis: Secondary | ICD-10-CM | POA: Diagnosis present

## 2024-03-13 DIAGNOSIS — Z87410 Personal history of cervical dysplasia: Secondary | ICD-10-CM

## 2024-03-13 HISTORY — DX: Personal history of cervical dysplasia: Z87.410

## 2024-03-13 HISTORY — DX: Leiomyoma of uterus, unspecified: D25.9

## 2024-03-13 HISTORY — PX: MYOMECTOMY: SHX85

## 2024-03-13 HISTORY — DX: Personal history of other infectious and parasitic diseases: Z86.19

## 2024-03-13 HISTORY — DX: Pelvic and perineal pain unspecified side: R10.20

## 2024-03-13 HISTORY — DX: Excessive and frequent menstruation with regular cycle: N92.0

## 2024-03-13 HISTORY — DX: Pelvic and perineal pain: R10.2

## 2024-03-13 LAB — POCT PREGNANCY, URINE: Preg Test, Ur: NEGATIVE

## 2024-03-13 LAB — ABO/RH: ABO/RH(D): O POS

## 2024-03-13 SURGERY — MYOMECTOMY, ABDOMINAL APPROACH
Anesthesia: General

## 2024-03-13 MED ORDER — DEXMEDETOMIDINE HCL IN NACL 80 MCG/20ML IV SOLN
INTRAVENOUS | Status: DC | PRN
  Administered 2024-03-13: 12 ug via INTRAVENOUS

## 2024-03-13 MED ORDER — HYDROMORPHONE HCL 1 MG/ML IJ SOLN
0.2500 mg | INTRAMUSCULAR | Status: DC | PRN
  Administered 2024-03-13 (×2): 0.5 mg via INTRAVENOUS

## 2024-03-13 MED ORDER — FENTANYL CITRATE (PF) 100 MCG/2ML IJ SOLN
25.0000 ug | INTRAMUSCULAR | Status: DC | PRN
  Administered 2024-03-13 (×3): 50 ug via INTRAVENOUS

## 2024-03-13 MED ORDER — MIDAZOLAM HCL 2 MG/2ML IJ SOLN
INTRAMUSCULAR | Status: AC
  Filled 2024-03-13: qty 2

## 2024-03-13 MED ORDER — HYDROMORPHONE HCL 1 MG/ML IJ SOLN
INTRAMUSCULAR | Status: DC | PRN
  Administered 2024-03-13 (×2): .25 mg via INTRAVENOUS

## 2024-03-13 MED ORDER — FENTANYL CITRATE (PF) 100 MCG/2ML IJ SOLN
INTRAMUSCULAR | Status: AC
Start: 2024-03-13 — End: 2024-03-13
  Filled 2024-03-13: qty 2

## 2024-03-13 MED ORDER — ROCURONIUM BROMIDE 10 MG/ML (PF) SYRINGE
PREFILLED_SYRINGE | INTRAVENOUS | Status: DC | PRN
  Administered 2024-03-13: 60 mg via INTRAVENOUS

## 2024-03-13 MED ORDER — ORAL CARE MOUTH RINSE: 15.0000 mL | Freq: Once | OROMUCOSAL | Status: DC

## 2024-03-13 MED ORDER — VASOPRESSIN 20 UNIT/ML IV SOLN
INTRAVENOUS | Status: AC
  Filled 2024-03-13: qty 1

## 2024-03-13 MED ORDER — DIPHENHYDRAMINE HCL 12.5 MG/5ML PO ELIX: 12.5000 mg | ORAL_SOLUTION | Freq: Four times a day (QID) | ORAL | Status: DC | PRN

## 2024-03-13 MED ORDER — CHLORHEXIDINE GLUCONATE 0.12 % MT SOLN: 15.0000 mL | Freq: Once | OROMUCOSAL | Status: DC

## 2024-03-13 MED ORDER — LACTATED RINGERS IV SOLN: INTRAVENOUS | Status: DC

## 2024-03-13 MED ORDER — OXYCODONE HCL 5 MG/5ML PO SOLN: 5.0000 mg | Freq: Once | ORAL | Status: DC | PRN

## 2024-03-13 MED ORDER — DEXAMETHASONE SODIUM PHOSPHATE 10 MG/ML IJ SOLN
INTRAMUSCULAR | Status: DC | PRN
  Administered 2024-03-13: 10 mg via INTRAVENOUS

## 2024-03-13 MED ORDER — IBUPROFEN 600 MG PO TABS
600.0000 mg | ORAL_TABLET | Freq: Four times a day (QID) | ORAL | Status: DC
  Administered 2024-03-14 – 2024-03-15 (×5): 600 mg via ORAL
  Filled 2024-03-13 (×5): qty 1

## 2024-03-13 MED ORDER — SODIUM CHLORIDE (PF) 0.9 % IJ SOLN
INTRAMUSCULAR | Status: AC
  Filled 2024-03-13: qty 50

## 2024-03-13 MED ORDER — FENTANYL CITRATE (PF) 250 MCG/5ML IJ SOLN
INTRAMUSCULAR | Status: DC | PRN
  Administered 2024-03-13: 100 ug via INTRAVENOUS
  Administered 2024-03-13: 50 ug via INTRAVENOUS
  Administered 2024-03-13: 100 ug via INTRAVENOUS

## 2024-03-13 MED ORDER — SUGAMMADEX SODIUM 200 MG/2ML IV SOLN
INTRAVENOUS | Status: DC | PRN
  Administered 2024-03-13: 200 mg via INTRAVENOUS

## 2024-03-13 MED ORDER — ONDANSETRON HCL 4 MG/2ML IJ SOLN
INTRAMUSCULAR | Status: DC | PRN
  Administered 2024-03-13: 4 mg via INTRAVENOUS

## 2024-03-13 MED ORDER — GENTAMICIN SULFATE 40 MG/ML IJ SOLN
5.0000 mg/kg | Freq: Once | INTRAVENOUS | Status: AC
  Administered 2024-03-13: 305.6 mg via INTRAVENOUS
  Filled 2024-03-13: qty 7.75

## 2024-03-13 MED ORDER — ONDANSETRON HCL 4 MG/2ML IJ SOLN: 4.0000 mg | Freq: Four times a day (QID) | INTRAMUSCULAR | Status: DC | PRN

## 2024-03-13 MED ORDER — MIDAZOLAM HCL 2 MG/2ML IJ SOLN
INTRAMUSCULAR | Status: DC | PRN
  Administered 2024-03-13: 2 mg via INTRAVENOUS

## 2024-03-13 MED ORDER — PROPOFOL 10 MG/ML IV BOLUS
INTRAVENOUS | Status: DC | PRN
  Administered 2024-03-13: 40 mg via INTRAVENOUS
  Administered 2024-03-13: 160 mg via INTRAVENOUS

## 2024-03-13 MED ORDER — SODIUM CHLORIDE 0.9% FLUSH: 9.0000 mL | INTRAVENOUS | Status: DC | PRN

## 2024-03-13 MED ORDER — DIPHENHYDRAMINE HCL 50 MG/ML IJ SOLN: 12.5000 mg | Freq: Four times a day (QID) | INTRAMUSCULAR | Status: DC | PRN

## 2024-03-13 MED ORDER — VASOPRESSIN 20 UNIT/ML IV SOLN
INTRAVENOUS | Status: DC | PRN
  Administered 2024-03-13: 11 mL via INTRAMUSCULAR

## 2024-03-13 MED ORDER — HYDROMORPHONE HCL 1 MG/ML IJ SOLN
INTRAMUSCULAR | Status: AC
  Filled 2024-03-13: qty 0.5

## 2024-03-13 MED ORDER — KETOROLAC TROMETHAMINE 30 MG/ML IJ SOLN
INTRAMUSCULAR | Status: AC
  Filled 2024-03-13: qty 1

## 2024-03-13 MED ORDER — OXYCODONE HCL 5 MG PO TABS: 5.0000 mg | ORAL_TABLET | Freq: Once | ORAL | Status: DC | PRN

## 2024-03-13 MED ORDER — LACTATED RINGERS IV SOLN
INTRAVENOUS | Status: DC
Start: 2024-03-13 — End: 2024-03-13

## 2024-03-13 MED ORDER — PROPOFOL 10 MG/ML IV BOLUS
INTRAVENOUS | Status: AC
  Filled 2024-03-13: qty 20

## 2024-03-13 MED ORDER — POVIDONE-IODINE 10 % EX SWAB
2.0000 | Freq: Once | CUTANEOUS | Status: AC
Start: 2024-03-13 — End: 2024-03-13
  Administered 2024-03-13: 2 via TOPICAL

## 2024-03-13 MED ORDER — ACETAMINOPHEN 500 MG PO TABS
1000.0000 mg | ORAL_TABLET | Freq: Once | ORAL | Status: AC
  Administered 2024-03-13: 1000 mg via ORAL
  Filled 2024-03-13: qty 2

## 2024-03-13 MED ORDER — 0.9 % SODIUM CHLORIDE (POUR BTL) OPTIME
TOPICAL | Status: DC | PRN
  Administered 2024-03-13 (×2): 1000 mL

## 2024-03-13 MED ORDER — ACETAMINOPHEN 10 MG/ML IV SOLN: 1000.0000 mg | Freq: Once | INTRAVENOUS | Status: DC | PRN

## 2024-03-13 MED ORDER — FENTANYL CITRATE (PF) 250 MCG/5ML IJ SOLN
INTRAMUSCULAR | Status: AC
  Filled 2024-03-13: qty 5

## 2024-03-13 MED ORDER — OXYCODONE HCL 5 MG PO TABS
5.0000 mg | ORAL_TABLET | ORAL | Status: DC | PRN
  Administered 2024-03-13 – 2024-03-15 (×4): 10 mg via ORAL
  Filled 2024-03-13 (×5): qty 2

## 2024-03-13 MED ORDER — HYDROMORPHONE HCL 1 MG/ML IJ SOLN
INTRAMUSCULAR | Status: AC
Start: 2024-03-13 — End: 2024-03-13
  Filled 2024-03-13: qty 1

## 2024-03-13 MED ORDER — CHLORHEXIDINE GLUCONATE 0.12 % MT SOLN
15.0000 mL | Freq: Once | OROMUCOSAL | Status: AC
  Administered 2024-03-13: 15 mL via OROMUCOSAL
  Filled 2024-03-13: qty 15

## 2024-03-13 MED ORDER — HYDROMORPHONE 1 MG/ML IV SOLN
INTRAVENOUS | Status: DC
  Administered 2024-03-13: 30 mg via INTRAVENOUS
  Administered 2024-03-13: 1.8 mg via INTRAVENOUS
  Administered 2024-03-14: 0.6 mg via INTRAVENOUS
  Administered 2024-03-14: 0.3 mg via INTRAVENOUS
  Administered 2024-03-14: 0.6 mg via INTRAVENOUS
  Filled 2024-03-13: qty 30

## 2024-03-13 MED ORDER — LIDOCAINE 2% (20 MG/ML) 5 ML SYRINGE
INTRAMUSCULAR | Status: DC | PRN
  Administered 2024-03-13: 80 mg via INTRAVENOUS

## 2024-03-13 MED ORDER — PHENYLEPHRINE 80 MCG/ML (10ML) SYRINGE FOR IV PUSH (FOR BLOOD PRESSURE SUPPORT)
PREFILLED_SYRINGE | INTRAVENOUS | Status: DC | PRN
  Administered 2024-03-13: 160 ug via INTRAVENOUS

## 2024-03-13 MED ORDER — CLINDAMYCIN PHOSPHATE 900 MG/50ML IV SOLN
900.0000 mg | INTRAVENOUS | Status: AC
  Administered 2024-03-13: 900 mg via INTRAVENOUS
  Filled 2024-03-13: qty 50

## 2024-03-13 MED ORDER — DROPERIDOL 2.5 MG/ML IJ SOLN: 0.6250 mg | Freq: Once | INTRAMUSCULAR | Status: DC | PRN

## 2024-03-13 MED ORDER — ACETAMINOPHEN 500 MG PO TABS
1000.0000 mg | ORAL_TABLET | Freq: Four times a day (QID) | ORAL | Status: DC
Start: 2024-03-13 — End: 2024-03-15
  Administered 2024-03-13 – 2024-03-15 (×8): 1000 mg via ORAL
  Filled 2024-03-13 (×9): qty 2

## 2024-03-13 MED ORDER — ORAL CARE MOUTH RINSE: 15.0000 mL | Freq: Once | OROMUCOSAL | Status: AC

## 2024-03-13 MED ORDER — KETOROLAC TROMETHAMINE 30 MG/ML IJ SOLN
30.0000 mg | Freq: Once | INTRAMUSCULAR | Status: AC
  Administered 2024-03-13: 30 mg via INTRAVENOUS

## 2024-03-13 MED ORDER — NALOXONE HCL 0.4 MG/ML IJ SOLN: 0.4000 mg | INTRAMUSCULAR | Status: DC | PRN

## 2024-03-13 MED ORDER — KETOROLAC TROMETHAMINE 30 MG/ML IJ SOLN
30.0000 mg | Freq: Four times a day (QID) | INTRAMUSCULAR | Status: AC
  Administered 2024-03-13 – 2024-03-14 (×3): 30 mg via INTRAVENOUS
  Filled 2024-03-13 (×3): qty 1

## 2024-03-13 SURGICAL SUPPLY — 44 items
BAG COUNTER SPONGE SURGICOUNT (BAG) ×2 IMPLANT
BARRIER ADHS 3X4 INTERCEED (GAUZE/BANDAGES/DRESSINGS) IMPLANT
CANISTER SUCT 3000ML PPV (MISCELLANEOUS) ×2 IMPLANT
DRAPE CESAREAN BIRTH W POUCH (DRAPES) ×2 IMPLANT
DRAPE SURG IRRIG POUCH 19X23 (DRAPES) ×2 IMPLANT
DRSG OPSITE POSTOP 4X10 (GAUZE/BANDAGES/DRESSINGS) ×2 IMPLANT
DURAPREP 26ML APPLICATOR (WOUND CARE) ×2 IMPLANT
ELECT CAUTERY BLADE 6.4 (BLADE) IMPLANT
ELECT NDL TIP 2.8 STRL (NEEDLE) IMPLANT
ELECT NEEDLE TIP 2.8 STRL (NEEDLE) IMPLANT
GLOVE BIO SURGEON STRL SZ7.5 (GLOVE) ×2 IMPLANT
GLOVE BIOGEL PI IND STRL 7.0 (GLOVE) ×4 IMPLANT
GLOVE BIOGEL PI IND STRL 7.5 (GLOVE) ×2 IMPLANT
GOWN STRL REUS W/ TWL LRG LVL3 (GOWN DISPOSABLE) ×6 IMPLANT
HEMOSTAT SURGICEL 2X14 (HEMOSTASIS) IMPLANT
KIT PINK PAD W/HEAD ARE REST (MISCELLANEOUS) IMPLANT
KIT PINK PAD W/HEAD ARM REST (MISCELLANEOUS) ×2 IMPLANT
KIT TURNOVER KIT B (KITS) ×2 IMPLANT
NDL HYPO 22X1.5 SAFETY MO (MISCELLANEOUS) ×2 IMPLANT
NEEDLE HYPO 22X1.5 SAFETY MO (MISCELLANEOUS) ×1 IMPLANT
NS IRRIG 1000ML POUR BTL (IV SOLUTION) ×2 IMPLANT
PACK ABDOMINAL GYN (CUSTOM PROCEDURE TRAY) ×2 IMPLANT
PAD ARMBOARD POSITIONER FOAM (MISCELLANEOUS) ×2 IMPLANT
PAD OB MATERNITY 11 LF (PERSONAL CARE ITEMS) ×2 IMPLANT
PENCIL SMOKE EVACUATOR (MISCELLANEOUS) ×2 IMPLANT
SPIKE FLUID TRANSFER (MISCELLANEOUS) ×2 IMPLANT
SPONGE SURGIFOAM ABS GEL 12-7 (HEMOSTASIS) IMPLANT
SPONGE T-LAP 18X18 ~~LOC~~+RFID (SPONGE) ×4 IMPLANT
STAPLER VISISTAT 35W (STAPLE) IMPLANT
STRIP CLOSURE SKIN 1/2X4 (GAUZE/BANDAGES/DRESSINGS) ×2 IMPLANT
SUT CHROMIC 2 0 CT 1 (SUTURE) ×2 IMPLANT
SUT MNCRL AB 3-0 PS2 27 (SUTURE) IMPLANT
SUT PDS AB 1 CTX 36 (SUTURE) IMPLANT
SUT PLAIN 2 0 XLH (SUTURE) IMPLANT
SUT VIC AB 0 CT1 18XCR BRD8 (SUTURE) IMPLANT
SUT VIC AB 0 CT1 27XBRD ANBCTR (SUTURE) ×8 IMPLANT
SUT VIC AB 0 CTX36XBRD ANBCTRL (SUTURE) IMPLANT
SUT VIC AB 2-0 CT1 TAPERPNT 27 (SUTURE) IMPLANT
SUT VIC AB 2-0 SH 27XBRD (SUTURE) IMPLANT
SUT VIC AB 3-0 SH 27X BRD (SUTURE) ×4 IMPLANT
SYR CONTROL 10ML LL (SYRINGE) ×2 IMPLANT
TAPE STRIPS DRAPE STRL (GAUZE/BANDAGES/DRESSINGS) IMPLANT
TOWEL GREEN STERILE FF (TOWEL DISPOSABLE) ×4 IMPLANT
TRAY FOLEY W/BAG SLVR 14FR (SET/KITS/TRAYS/PACK) ×2 IMPLANT

## 2024-03-13 NOTE — Op Note (Signed)
 Preop Diagnosis: Symptomatic Fibroids   Postop Diagnosis: Symptomatic Fibroids  Procedure: ABDOMINAL MYOMECTOMY   Anesthesia: General   Anesthesiologist: Kaylyn Layer, MD   Attending: Osborn Coho, MD   Assistant: Morrie Sheldon, surgical tech  Findings: Approximatey 9 fibroids, the largest about 7-8 cm 3 posterior wall incisions, the largest pedunculated at fundus.  Pathology: Fibroids  Fluids: 1700 cc  UOP: 75 cc  EBL: 50 cc  Complications: None  Procedure: The patient was taken to the operating room after the risks, benefits and alternatives were discussed with the patient. The patient verbalized understanding and consent signed and witnessed. The patient was placed under general anesthesia with an ET per anesthesiologist and prepped and draped in the normal sterile fashion.  Time Out was performed per protocol.  A pfannenstiel skin incision was made and carried down to the underlying fascia.  The fascial incision was extended bilaterally with the Mayo scissors.  The inferior aspect of the fascial incision was grasped with Kocher clamps and the fascia excised from the rectus muscle.  The same was done on the superior aspect of the fascial incision.  The muscle was separated in the midline and the peritoneum entered bluntly and extended manually.  The bowel was packed away with 3 moist laparotomy sponges.  A towel clamp was placed on the fundal fibroid and the uterus elevated up to the incision.  Vasopressin at a concentration of 20 units of vasopressin in 50cc of normal saline was injected into the base of the pedunculated fundal fibroid and the fibroid removed with the bovie.  A posterior fibroid was injected with the vasopressin and incised with the bovie, shelled out and handed off.  The same was done for the remaining fibroids.  All beds were repaired with 3-0 vicryl and the serosa was reapproximated with 3-0 vicryl via a baseball stitch.  Copious irrigation was performed.   Intercede was placed on all incisions.  One incision was at the fundus and three were on the posterior wall of the uterus. The endometrial cavity was possibly entered slightly with one of the deeper fibroids.  Laparotomy sponges were removed and the peritoneum repaired with 2-0 chromic.  The fascia was repaired with 0 vicryl.  The subcutaneous tissue was irrigated and made hemostatic with the bovie. The skin was reapproximated with 3-0 monocryl and steristrips with benzoin placed followed by a honeycomb dressing.  Sponge lap and needle count was correct per protocol and the patient was awaiting extubation in stable condition.

## 2024-03-13 NOTE — Anesthesia Preprocedure Evaluation (Signed)
 Anesthesia Evaluation  Patient identified by MRN, date of birth, ID band Patient awake    Reviewed: Allergy & Precautions, H&P , NPO status , Patient's Chart, lab work & pertinent test results  Airway Mallampati: II  TM Distance: >3 FB Neck ROM: Full    Dental no notable dental hx.    Pulmonary neg pulmonary ROS   Pulmonary exam normal breath sounds clear to auscultation       Cardiovascular negative cardio ROS Normal cardiovascular exam Rhythm:Regular Rate:Normal     Neuro/Psych negative neurological ROS  negative psych ROS   GI/Hepatic Neg liver ROS,GERD  ,,  Endo/Other  negative endocrine ROS    Renal/GU negative Renal ROS    leimoyoma of uterus    Musculoskeletal negative musculoskeletal ROS (+)    Abdominal   Peds negative pediatric ROS (+)  Hematology negative hematology ROS (+)   Anesthesia Other Findings   Reproductive/Obstetrics negative OB ROS                             Anesthesia Physical Anesthesia Plan  ASA: 2  Anesthesia Plan: General   Post-op Pain Management: Tylenol PO (pre-op)*   Induction: Intravenous  PONV Risk Score and Plan: 3 and Ondansetron, Dexamethasone and Midazolam  Airway Management Planned: LMA  Additional Equipment: None  Intra-op Plan:   Post-operative Plan: Extubation in OR  Informed Consent: I have reviewed the patients History and Physical, chart, labs and discussed the procedure including the risks, benefits and alternatives for the proposed anesthesia with the patient or authorized representative who has indicated his/her understanding and acceptance.     Dental advisory given  Plan Discussed with: CRNA  Anesthesia Plan Comments:        Anesthesia Quick Evaluation

## 2024-03-13 NOTE — Transfer of Care (Signed)
 Immediate Anesthesia Transfer of Care Note  Patient: Kimberly Cooper  Procedure(s) Performed: MYOMECTOMY, ABDOMINAL APPROACH  Patient Location: PACU  Anesthesia Type:General  Level of Consciousness: awake, alert , and oriented  Airway & Oxygen Therapy: Patient Spontanous Breathing  Post-op Assessment: Report given to RN  Post vital signs: Reviewed and stable  Last Vitals:  Vitals Value Taken Time  BP 108/67 03/13/24 1601  Temp 37.1 C 03/13/24 1600  Pulse 104 03/13/24 1602  Resp 15 03/13/24 1602  SpO2 100 % 03/13/24 1602  Vitals shown include unfiled device data.  Last Pain:  Vitals:   03/13/24 1602  TempSrc:   PainSc: 10-Worst pain ever      Patients Stated Pain Goal: 3 (03/13/24 1110)  Complications: No notable events documented.

## 2024-03-13 NOTE — H&P (Addendum)
 Kimberly Cooper is an 33 y.o. female. The patient presents for scheduled abdominal myomectomy.  Pertinent Gynecological History: Menses:  regular cycles Bleeding: heavy and painful Contraception:  northindrone acetate DES exposure: unknown Blood transfusions: none Sexually transmitted diseases: see below Previous GYN Procedures:  LEEP   Last mammogram:  n/a Last pap:  s/p LEEP with CIN 2  Date: 06-25-23 OB History: G0  Menstrual History: Menarche age: 52 No LMP recorded. (Menstrual status: Oral contraceptives).    Past Medical History:  Diagnosis Date   GERD (gastroesophageal reflux disease)    History of cervical dysplasia    07/ 2024  s/p LEEP for CIN 2   History of chlamydia    History of cold sores    History of gonorrhea    History of methicillin resistant staphylococcus aureus (MRSA) 2003   ABDOMINAL CYST infection,  treated   Leiomyoma of uterus    Menorrhagia    Pelvic pain     Past Surgical History:  Procedure Laterality Date   NO PAST SURGERIES      Family History  Problem Relation Age of Onset   Hypertension Mother    Heart disease Father 62       MI   Other Father        gunshot, paralysis   Breast cancer Maternal Aunt    Breast cancer Paternal Aunt    Cancer Paternal Aunt 40       stomach cancer   Breast cancer Maternal Grandmother    Stroke Maternal Grandmother    Cancer Maternal Grandfather        lung/prostate    Social History:  reports that she has never smoked. She has never used smokeless tobacco. She reports that she does not currently use alcohol. She reports that she does not use drugs.  Allergies:  Allergies  Allergen Reactions   Other Hives and Swelling    All Nuts: Swelling   (peanuts, cashews)   Penicillins Hives    Medications Prior to Admission  Medication Sig Dispense Refill Last Dose/Taking   acetaminophen (TYLENOL) 500 MG tablet Take 500-1,000 mg by mouth every 6 (six) hours as needed (pain.).   Past Month    Cholecalciferol (VITAMIN D3) 50 MCG (2000 UT) capsule Take 2,000 Units by mouth daily after lunch.   03/12/2024   Dapsone 5 % topical gel Apply 1 application topically to the face every morning. 60 g 2 Past Month   EPINEPHrine 0.3 mg/0.3 mL IJ SOAJ injection Inject 0.3 mg into the muscle as needed for anaphylaxis. (Patient taking differently: Inject 0.3 mg into the muscle as needed for anaphylaxis.) 2 each 0 Taking Differently   fluocinonide cream (LIDEX) 0.05 % Apply 1 Application topically 2 (two) times daily. (Patient taking differently: Apply 1 Application topically 2 (two) times daily as needed.) 60 g 0 Past Month   hydroquinone 4 % cream Apply a small amount to affected area twice a day as needed until desired color is achieved 56.7 g 1 Past Month   ibuprofen (ADVIL) 600 MG tablet Take 1 tablet (600 mg total) by mouth every 6 (six) hours as needed 30 tablet 1 Taking   ibuprofen (ADVIL) 800 MG tablet Take 1 tablet (800 mg total) by mouth every 8 (eight) hours as needed. 40 tablet 1 Past Month   norethindrone (AYGESTIN) 5 MG tablet Take 2 tablets (10 mg total) by mouth daily. (Patient taking differently: Take 15 mg by mouth daily after lunch.) 180 tablet 3 03/13/2024 at  9:15 AM   oxyCODONE (OXY IR/ROXICODONE) 5 MG immediate release tablet Take 1 tablet (5 mg total) by mouth every 6 (six) hours as needed. 40 tablet 0 Past Month   tretinoin (RETIN-A) 0.1 % cream Apply pea size amount to face nightly followed by moisturizer 45 g 2 Past Month    Review of Systems Denies F/C/N/V/D  Blood pressure 130/86, pulse (!) 102, temperature 97.6 F (36.4 C), temperature source Oral, resp. rate 20, height 5' (1.524 m), weight 84.4 kg, SpO2 95%. Physical Exam Lungs CTA CV RRR Abdominal soft, NT Extremities no calf tenderness  Results for orders placed or performed during the hospital encounter of 03/13/24 (from the past 24 hours)  Pregnancy, urine POC     Status: None   Collection Time: 03/13/24 11:14 AM   Result Value Ref Range   Preg Test, Ur NEGATIVE NEGATIVE  ABO/Rh     Status: None   Collection Time: 03/13/24 11:45 AM  Result Value Ref Range   ABO/RH(D)      O POS Performed at [facility], [address] [place], [place] [address]     No results found.  Assessment/Plan: 65 G0 with heavy and painful cycles d/t fibroids presenting today for abdominal myomectomy and desires future fertility.  Risks benefits and alternatives reviewed with the patient including but not limited bleeding infection and injury.  Questions answered and consent signed and witnessed.  She is agreeable to a blood transfusion if needed.  Purcell Nails 03/13/2024, 12:54 PM

## 2024-03-14 ENCOUNTER — Encounter (HOSPITAL_COMMUNITY): Payer: Self-pay | Admitting: Obstetrics and Gynecology

## 2024-03-14 DIAGNOSIS — D259 Leiomyoma of uterus, unspecified: Secondary | ICD-10-CM | POA: Diagnosis not present

## 2024-03-14 DIAGNOSIS — K219 Gastro-esophageal reflux disease without esophagitis: Secondary | ICD-10-CM | POA: Diagnosis not present

## 2024-03-14 DIAGNOSIS — Z87410 Personal history of cervical dysplasia: Secondary | ICD-10-CM | POA: Diagnosis not present

## 2024-03-14 DIAGNOSIS — Z880 Allergy status to penicillin: Secondary | ICD-10-CM | POA: Diagnosis not present

## 2024-03-14 LAB — BASIC METABOLIC PANEL
Anion gap: 6 (ref 5–15)
BUN: 6 mg/dL (ref 6–20)
CO2: 24 mmol/L (ref 22–32)
Calcium: 8 mg/dL — ABNORMAL LOW (ref 8.9–10.3)
Chloride: 103 mmol/L (ref 98–111)
Creatinine, Ser: 0.8 mg/dL (ref 0.44–1.00)
GFR, Estimated: >60 mL/min (ref >60)
Glucose, Bld: 86 mg/dL (ref 70–99)
Potassium: 3.4 mmol/L — ABNORMAL LOW (ref 3.5–5.1)
Sodium: 133 mmol/L — ABNORMAL LOW (ref 135–145)

## 2024-03-14 LAB — CBC
HCT: 34.3 % — ABNORMAL LOW (ref 36.0–46.0)
Hemoglobin: 11.7 g/dL — ABNORMAL LOW (ref 12.0–15.0)
MCH: 29.9 pg (ref 26.0–34.0)
MCHC: 34.1 g/dL (ref 30.0–36.0)
MCV: 87.7 fL (ref 80.0–100.0)
Platelets: 307 10*3/uL (ref 150–400)
RBC: 3.91 MIL/uL (ref 3.87–5.11)
RDW: 13.1 % (ref 11.5–15.5)
WBC: 11.4 10*3/uL — ABNORMAL HIGH (ref 4.0–10.5)
nRBC: 0 % (ref 0.0–0.2)

## 2024-03-14 MED ORDER — METOCLOPRAMIDE HCL 5 MG/ML IJ SOLN
10.0000 mg | Freq: Four times a day (QID) | INTRAMUSCULAR | Status: AC
Start: 2024-03-14 — End: 2024-03-15
  Administered 2024-03-14 – 2024-03-15 (×4): 10 mg via INTRAVENOUS
  Filled 2024-03-14 (×4): qty 2

## 2024-03-14 MED ORDER — PHENYLEPHRINE HCL-NACL 20-0.9 MG/250ML-% IV SOLN
INTRAVENOUS | Status: AC
  Filled 2024-03-14: qty 500

## 2024-03-14 NOTE — Anesthesia Postprocedure Evaluation (Signed)
 Anesthesia Post Note  Patient: Kimberly Cooper  Procedure(s) Performed: MYOMECTOMY, ABDOMINAL APPROACH     Patient location during evaluation: PACU Anesthesia Type: General Level of consciousness: awake and alert Pain management: pain level controlled Vital Signs Assessment: post-procedure vital signs reviewed and stable Respiratory status: spontaneous breathing, nonlabored ventilation and respiratory function stable Cardiovascular status: blood pressure returned to baseline Postop Assessment: no apparent nausea or vomiting Anesthetic complications: no   No notable events documented.            Shanda Howells

## 2024-03-14 NOTE — Progress Notes (Signed)
 1 Day Post-Op Procedure(s) (LRB): MYOMECTOMY, ABDOMINAL APPROACH (N/A)  Subjective: Patient reports no problems voiding.  Pt feels her stomach is tight.  She is laying in bed currently.  She has ambulated some but is sluggish.  I was contacted by RN overnight because pain was inadequately controlled with pain meds by mouth with the toradol.  Objective: I have reviewed patient's vital signs and intake and output.  General: alert, cooperative, fatigued, and no distress Resp: clear to auscultation bilaterally Cardio: regular rate and rhythm GI: soft, decreased BS, slightly distended, no rebound and appropriately tender Extremities: no calf tenderness Vaginal Bleeding: minimal Incision c/d/dressing intact with a dime sized stain at the edge  Assessment: s/p Procedure(s): MYOMECTOMY, ABDOMINAL APPROACH (N/A):  delayed bowel function  Plan: POD 1 Return to clears until bowel function improved - will start IV reglan to aid bowel motility.  Once bowel function returns, will advance diet as tolerated. Encouraged ambulation and IS Working on improved pain control.  Will continue prn IV pain medication.   LOS: 1 day    Purcell Nails, MD 03/14/2024, 2:55 PM

## 2024-03-15 ENCOUNTER — Other Ambulatory Visit (HOSPITAL_COMMUNITY): Payer: Self-pay

## 2024-03-15 LAB — SURGICAL PATHOLOGY

## 2024-03-15 MED ORDER — OXYCODONE HCL 5 MG PO TABS
5.0000 mg | ORAL_TABLET | Freq: Four times a day (QID) | ORAL | 0 refills | Status: DC | PRN
  Filled 2024-03-15: qty 20, 5d supply, fill #0

## 2024-03-15 MED ORDER — IBUPROFEN 600 MG PO TABS
600.0000 mg | ORAL_TABLET | Freq: Four times a day (QID) | ORAL | 1 refills | Status: DC | PRN
  Filled 2024-03-15 – 2024-06-15 (×3): qty 40, 10d supply, fill #0

## 2024-03-15 MED ORDER — ACETAMINOPHEN 500 MG PO TABS
1000.0000 mg | ORAL_TABLET | Freq: Four times a day (QID) | ORAL | 1 refills | Status: DC | PRN
  Filled 2024-03-15 – 2024-06-15 (×3): qty 40, 5d supply, fill #0

## 2024-03-15 NOTE — Discharge Summary (Signed)
 Physician Discharge Summary  Patient ID: Kimberly Cooper MRN: [id number] DOB/AGE: Oct 06, 1991 33 y.o.  Admit date: 03/13/2024 Discharge date: 03/15/2024  Admission Diagnoses:  Discharge Diagnoses:  Principal Problem:   Leiomyoma of uterus, unspecified Active Problems:   S/P myomectomy   Discharged Condition: good  Hospital Course: POD#1 pt slightly distended, not passing flatus and still requiring IV pain medication.  POD #2 passing flatus, abdomen improved and pain controlled on po pain meds.  Consults: None  Significant Diagnostic Studies: labs: CBC and BMP post appropriate  Treatments: s/p abdominal myomectomy  Discharge Exam: Blood pressure 130/77, pulse 100, temperature 98.1 F (36.7 C), temperature source Oral, resp. rate 18, height 5' (1.524 m), weight 84.4 kg, SpO2 99%. General appearance: alert, cooperative, and no distress Resp: clear to auscultation bilaterally Cardio: regular rate and rhythm GI: soft, NABS, ND, appropriately tender, no rebound or guarding Extremities: no calf tenderness Incision/Wound: Dressing c/d/I  Minimal vaginal bleeding  Disposition: Discharge disposition: 01-Home or Self Care        Allergies as of 03/15/2024       Reactions   Other Hives, Swelling   All Nuts: Swelling   (peanuts, cashews)   Penicillins Hives        Medication List     STOP taking these medications    norethindrone 5 MG tablet Commonly known as: AYGESTIN       TAKE these medications    Acetaminophen Extra Strength 500 MG Tabs Take 2 tablets (1,000 mg total) by mouth every 6 (six) hours as needed for moderate pain (pain score 4-6) or mild pain (pain score 1-3). What changed:  how much to take reasons to take this   Dapsone 5 % topical gel Apply 1 application topically to the face every morning.   EPINEPHrine 0.3 mg/0.3 mL Soaj injection Commonly known as: EPI-PEN Inject 0.3 mg into the muscle as needed for anaphylaxis.   fluocinonide  cream 0.05 % Commonly known as: LIDEX Apply 1 Application topically 2 (two) times daily. What changed:  when to take this reasons to take this   hydroquinone 4 % cream Apply a small amount to affected area twice a day as needed until desired color is achieved   ibuprofen 600 MG tablet Commonly known as: ADVIL Take 1 tablet (600 mg total) by mouth every 6 (six) hours as needed. What changed:  when to take this reasons to take this Another medication with the same name was removed. Continue taking this medication, and follow the directions you see here.   oxyCODONE 5 MG immediate release tablet Commonly known as: Oxy IR/ROXICODONE Take 1 tablet (5 mg total) by mouth every 6 (six) hours as needed for moderate pain (pain score 4-6), severe pain (pain score 7-10) or breakthrough pain (may take 1-2 tablets every 6 hours as needed). What changed: reasons to take this   tretinoin 0.1 % cream Commonly known as: RETIN-A Apply pea size amount to face nightly followed by moisturizer   Vitamin D3 50 MCG (2000 UT) capsule Take 2,000 Units by mouth daily after lunch.        Follow-up Information     [facility]. Go in 6 week(s).   Specialty: Obstetrics and Gynecology Why: for post op appointment Contact information: [address] [place] [address] [phone]                Signed: Purcell Nails 03/15/2024, 5:07 PM

## 2024-03-31 ENCOUNTER — Other Ambulatory Visit (HOSPITAL_COMMUNITY): Payer: Self-pay

## 2024-03-31 MED ORDER — FLUCONAZOLE 150 MG PO TABS
150.0000 mg | ORAL_TABLET | ORAL | 0 refills | Status: DC
  Filled 2024-03-31: qty 1, 1d supply, fill #0

## 2024-04-24 DIAGNOSIS — Z9889 Other specified postprocedural states: Secondary | ICD-10-CM | POA: Diagnosis not present

## 2024-04-24 DIAGNOSIS — Z01419 Encounter for gynecological examination (general) (routine) without abnormal findings: Secondary | ICD-10-CM | POA: Diagnosis not present

## 2024-04-24 DIAGNOSIS — N871 Moderate cervical dysplasia: Secondary | ICD-10-CM | POA: Diagnosis not present

## 2024-04-24 DIAGNOSIS — B977 Papillomavirus as the cause of diseases classified elsewhere: Secondary | ICD-10-CM | POA: Diagnosis not present

## 2024-04-24 DIAGNOSIS — Z86018 Personal history of other benign neoplasm: Secondary | ICD-10-CM | POA: Diagnosis not present

## 2024-04-27 ENCOUNTER — Telehealth: Admitting: Physician Assistant

## 2024-04-27 DIAGNOSIS — B3731 Acute candidiasis of vulva and vagina: Secondary | ICD-10-CM | POA: Diagnosis not present

## 2024-04-28 MED ORDER — FLUCONAZOLE 150 MG PO TABS: 150.0000 mg | ORAL_TABLET | ORAL | 0 refills | Status: DC | PRN

## 2024-04-28 NOTE — Progress Notes (Signed)

## 2024-06-15 ENCOUNTER — Other Ambulatory Visit (HOSPITAL_COMMUNITY): Payer: Self-pay

## 2024-06-16 DIAGNOSIS — Z800 Family history of malignant neoplasm of digestive organs: Secondary | ICD-10-CM | POA: Diagnosis not present

## 2024-11-27 ENCOUNTER — Ambulatory Visit: Payer: Self-pay

## 2024-11-27 NOTE — Telephone Encounter (Signed)
 FYI Only or Action Required?: FYI only for provider: appointment scheduled on 11/28/24.  Patient was last seen in primary care on 01/26/2024 by Kimberly Elsie BROCKS, PA-C.  Called Nurse Triage reporting Foot Injury.  Symptoms began yesterday.  Interventions attempted: Ice/heat application and Other: elevation; patient states she is about to take ibuprofen .  Symptoms are: pain worsening, swelling improved.  Triage Disposition: See Physician Within 24 Hours  Patient/caregiver understands and will follow disposition?: Yes           Copied from CRM #[id number]. Topic: Clinical - Red Word Triage >> Nov 27, 2024  7:53 AM Carlyon D wrote: Red Word that prompted transfer to Nurse Triage: L top of the foot, pt dropped something on her foot yesterday 12/7 pt states foot it swollen and very painful. Reason for Disposition  [1] Limp when walking AND [2] due to a direct blow or crushing injury  Answer Assessment - Initial Assessment Questions 1. MECHANISM: How did the injury happen? (e.g., twisting injury, direct blow)      Direct blow: patient dropped picture frame on top of left foot yesterday.  2. ONSET: When did the injury happen? (e.g., minutes or hours ago)      Teacher, English As A Foreign Language.  3. LOCATION: Where is the injury located?      Left top of foot.  4. APPEARANCE of INJURY: What does the injury look like?      Swelling.  5. WEIGHT-BEARING: Can you put weight on that foot? Can you walk (four steps or more)?       Yes, but it is very painful. Also painful to flex foot or wiggle toes.  6. SIZE: For cuts, bruises, or swelling, ask: How large is it? (e.g., inches or centimeters;  entire joint)      Swelling yesterday was moderate (ice and elevation) now is more mild today.  7. PAIN: Is there pain? If Yes, ask: How bad is the pain? What does it keep you from doing? (Scale 0-10; or none, mild, moderate, severe)     8/10.  8. TETANUS: For any breaks in the skin, ask: When was your  last tetanus booster?     08/2023.  9. OTHER SYMPTOMS: Do you have any other symptoms?      Small cut to top of left foot.  10. PREGNANCY: Is there any chance you are pregnant? When was your last menstrual period?       About a month ago.  Protocols used: Foot Injury-A-AH

## 2024-11-28 ENCOUNTER — Ambulatory Visit: Payer: Self-pay | Admitting: Family Medicine

## 2024-11-28 ENCOUNTER — Ambulatory Visit: Admitting: Family Medicine

## 2024-11-28 ENCOUNTER — Ambulatory Visit: Admitting: Medical

## 2024-11-28 ENCOUNTER — Encounter: Payer: Self-pay | Admitting: Family Medicine

## 2024-11-28 ENCOUNTER — Ambulatory Visit (HOSPITAL_BASED_OUTPATIENT_CLINIC_OR_DEPARTMENT_OTHER)
Admission: RE | Admit: 2024-11-28 | Discharge: 2024-11-28 | Disposition: A | Source: Ambulatory Visit | Attending: Family Medicine | Admitting: Family Medicine

## 2024-11-28 VITALS — BP 110/78 | HR 89 | Temp 98.1°F | Resp 18 | Ht 60.0 in | Wt 197.2 lb

## 2024-11-28 DIAGNOSIS — M79672 Pain in left foot: Secondary | ICD-10-CM

## 2024-11-28 DIAGNOSIS — M25572 Pain in left ankle and joints of left foot: Secondary | ICD-10-CM | POA: Diagnosis not present

## 2024-11-28 NOTE — Progress Notes (Signed)
 +  Subjective:    Patient ID: Kimberly Cooper, female    DOB: 06-08-1991, 33 y.o.   MRN: [id number]  Chief Complaint  Patient presents with   Foot Injury    Left foot injury, pt states dropping a picture frame on foot, pt states having bruising and pain.     HPI Patient is in today for L foot pain.   Discussed the use of AI scribe software for clinical note transcription with the patient, who gave verbal consent to proceed.  History of Present Illness Kimberly Cooper is a 33 year old female who presents with foot pain after dropping a frame on her foot.  On Sunday, she dropped a large frame on her foot while at home, resulting in significant pain. The frame slipped while being bagged, impacting her foot directly. Since the incident, she has experienced persistent pain, particularly when walking or moving her toes.  The pain is localized to the area where the frame fell and is described as severe. It is exacerbated by walking and toe movement but does not hurt when sitting still unless she wiggles her toes or flexes her foot.  She has never experienced a fracture before and is unsure if the current pain indicates a fracture. No possibility of pregnancy.    Past Medical History:  Diagnosis Date   GERD (gastroesophageal reflux disease)    History of cervical dysplasia    07/ 2024  s/p LEEP for CIN 2   History of chlamydia    History of cold sores    History of gonorrhea    History of methicillin resistant staphylococcus aureus (MRSA) 2003   ABDOMINAL CYST infection,  treated   Leiomyoma of uterus    Menorrhagia    Pelvic pain     Past Surgical History:  Procedure Laterality Date   MYOMECTOMY N/A 03/13/2024   Procedure:  MYOMECTOMY, ABDOMINAL APPROACH;  Surgeon: Henry Slough, MD;  Location: [facility] OR;  Service: Gynecology;  Laterality: N/A;   NO PAST SURGERIES      Family History  Problem Relation Age of Onset   Hypertension Mother    Heart disease Father 13       MI   Other Father        gunshot, paralysis   Breast cancer Maternal Aunt    Breast cancer Paternal Aunt    Cancer Paternal Aunt 62       stomach cancer   Breast cancer Maternal Grandmother    Stroke Maternal Grandmother    Cancer Maternal Grandfather        lung/prostate    Social History   Socioeconomic History   Marital status: Single    Spouse name: Not on file  Number of children: Not on file   Years of education: Not on file   Highest education level: Bachelor's degree (e.g., BA, AB, BS)  Occupational History   Not on file  Tobacco Use   Smoking status: Never   Smokeless tobacco: Never  Vaping Use   Vaping status: Never Used  Substance and Sexual Activity   Alcohol use: Not Currently    Comment: occasional   Drug use: Never   Sexual activity: Yes    Birth control/protection: None  Other Topics Concern   Not on file  Social History Narrative   Not on file   Social Drivers of Health   Financial Resource Strain: Low Risk  (11/27/2024)   Overall Financial Resource Strain (CARDIA)    Difficulty of Paying Living Expenses: Not hard at all  Food Insecurity: No Food Insecurity (11/27/2024)   Hunger Vital Sign    Worried About Running Out of Food in the Last Year: Never true    Ran Out of Food in the Last Year: Never true  Transportation Needs: No Transportation Needs (11/27/2024)   PRAPARE - Administrator, Civil Service (Medical): No    Lack of Transportation (Non-Medical): No  Physical Activity: Sufficiently Active (11/27/2024)   Exercise Vital Sign    Days of Exercise per Week: 5 days    Minutes of Exercise per Session: 30 min  Stress: No Stress Concern Present (11/27/2024)   [employer] of  Occupational Health - Occupational Stress Questionnaire    Feeling of Stress: Only a little  Social Connections: Moderately Integrated (11/27/2024)   Social Connection and Isolation Panel    Frequency of Communication with Friends and Family: More than three times a week    Frequency of Social Gatherings with Friends and Family: Twice a week    Attends Religious Services: More than 4 times per year    Active Member of [employer] or Organizations: Yes    Attends Engineer, Structural: More than 4 times per year    Marital Status: Never married  Intimate Partner Violence: Not At Risk (03/14/2024)   Humiliation, Afraid, Rape, and Kick questionnaire    Fear of Current or Ex-Partner: No    Emotionally Abused: No    Physically Abused: No    Sexually Abused: No    Outpatient Medications Prior to Visit  Medication Sig Dispense Refill   Cholecalciferol  (VITAMIN D3) 50 MCG (2000 UT) capsule Take 2,000 Units by mouth daily after lunch.     acetaminophen  (TYLENOL ) 500 MG tablet Take 2 tablets (1,000 mg total) by mouth every 6 (six) hours as needed for moderate pain (pain score 4-6) or mild pain (pain score 1-3). 40 tablet 1   Dapsone  5 % topical gel Apply 1 application topically to the face every morning. 60 g 2   EPINEPHrine  0.3 mg/0.3 mL IJ SOAJ injection Inject 0.3 mg into the muscle as needed for anaphylaxis. (Patient taking differently: Inject 0.3 mg into the muscle as needed for anaphylaxis.) 2 each 0   fluconazole  (DIFLUCAN ) 150 MG tablet Take 1 tablet (150 mg total) by mouth every 3 (three) days as needed. 2 tablet 0   fluocinonide  cream (LIDEX ) 0.05 % Apply 1 Application topically 2 (two) times daily. (Patient taking differently: Apply 1 Application topically 2 (two) times daily as needed.) 60 g 0   hydroquinone  4 % cream Apply a small amount to affected area twice a day as needed until desired color is achieved 56.7 g 1  ibuprofen  (ADVIL ) 600 MG tablet Take 1 tablet (600 mg total) by mouth  every 6 (six) hours as needed. 40 tablet 1   oxyCODONE  (OXY IR/ROXICODONE ) 5 MG immediate release tablet Take 1 tablet (5 mg total) by mouth every 6 (six) hours as needed for moderate pain (pain score 4-6), severe pain (pain score 7-10) or breakthrough pain (may take 1-2 tablets every 6 hours as needed). 20 tablet 0   tretinoin  (RETIN-A ) 0.1 % cream Apply pea size amount to face nightly followed by moisturizer 45 g 2   No facility-administered medications prior to visit.    Allergies  Allergen Reactions   Other Hives and Swelling    All Nuts: Swelling   (peanuts, cashews)   Penicillins Hives    Review of Systems  Constitutional:  Negative for fever and malaise/fatigue.  HENT:  Negative for congestion.   Eyes:  Negative for blurred vision.  Respiratory:  Negative for shortness of breath.   Cardiovascular:  Negative for chest pain, palpitations and leg swelling.  Gastrointestinal:  Negative for abdominal pain, blood in stool and nausea.  Genitourinary:  Negative for dysuria and frequency.  Musculoskeletal:  Positive for joint pain. Negative for falls.  Skin:  Negative for rash.  Neurological:  Negative for dizziness, loss of consciousness and headaches.  Endo/Heme/Allergies:  Negative for environmental allergies.  Psychiatric/Behavioral:  Negative for depression. The patient is not nervous/anxious.        Objective:    Physical Exam Vitals and nursing note reviewed.  Constitutional:      General: She is not in acute distress.    Appearance: Normal appearance. She is well-developed.  HENT:     Head: Normocephalic and atraumatic.  Eyes:     General: No scleral icterus.       Right eye: No discharge.        Left eye: No discharge.  Cardiovascular:     Rate and Rhythm: Normal rate and regular rhythm.     Heart sounds: No murmur heard. Pulmonary:     Effort: Pulmonary effort is normal. No respiratory distress.     Breath sounds: Normal breath sounds.  Musculoskeletal:         General: Swelling and tenderness present. Normal range of motion.     Cervical back: Normal range of motion and neck supple.     Right lower leg: No edema.     Left lower leg: No edema.     Right foot: Normal.     Left foot: Swelling, tenderness and bony tenderness present.       Legs:  Skin:    General: Skin is warm and dry.  Neurological:     Mental Status: She is alert and oriented to person, place, and time.  Psychiatric:        Mood and Affect: Mood normal.        Behavior: Behavior normal.        Thought Content: Thought content normal.        Judgment: Judgment normal.     BP 110/78 (BP Location: Left Arm, Patient Position: Sitting, Cuff Size: Large)   Pulse 89   Temp 98.1 F (36.7 C) (Oral)   Resp 18   Ht 5' (1.524 m)   Wt 197 lb 3.2 oz (89.4 kg)   SpO2 98%   BMI 38.51 kg/m  Wt Readings from Last 3 Encounters:  11/28/24 197 lb 3.2 oz (89.4 kg)  03/13/24 186 lb (84.4 kg)  09/13/23 188 lb  4 oz (85.4 kg)    Diabetic Foot Exam - Simple   No data filed    Lab Results  Component Value Date   WBC 11.4 (H) 03/14/2024   HGB 11.7 (L) 03/14/2024   HCT 34.3 (L) 03/14/2024   PLT 307 03/14/2024   GLUCOSE 86 03/14/2024   CHOL 187 06/10/2021   TRIG 82 06/10/2021   HDL 53 06/10/2021   LDLCALC 119 (H) 06/10/2021   ALT 13 12/25/2023   AST 15 12/25/2023   NA 133 (L) 03/14/2024   K 3.4 (L) 03/14/2024   CL 103 03/14/2024   CREATININE 0.80 03/14/2024   BUN 6 03/14/2024   CO2 24 03/14/2024   TSH 1.948 12/07/2022   HGBA1C 4.8 06/10/2021    Lab Results  Component Value Date   TSH 1.948 12/07/2022   Lab Results  Component Value Date   WBC 11.4 (H) 03/14/2024   HGB 11.7 (L) 03/14/2024   HCT 34.3 (L) 03/14/2024   MCV 87.7 03/14/2024   PLT 307 03/14/2024   Lab Results  Component Value Date   NA 133 (L) 03/14/2024   K 3.4 (L) 03/14/2024   CO2 24 03/14/2024   GLUCOSE 86 03/14/2024   BUN 6 03/14/2024   CREATININE 0.80 03/14/2024   BILITOT 0.4 12/25/2023    ALKPHOS 40 12/25/2023   AST 15 12/25/2023   ALT 13 12/25/2023   PROT 7.9 12/25/2023   ALBUMIN 3.6 12/25/2023   CALCIUM 8.0 (L) 03/14/2024   ANIONGAP 6 03/14/2024   EGFR 120 06/10/2021   GFR 112.50 09/13/2023   Lab Results  Component Value Date   CHOL 187 06/10/2021   Lab Results  Component Value Date   HDL 53 06/10/2021   Lab Results  Component Value Date   LDLCALC 119 (H) 06/10/2021   Lab Results  Component Value Date   TRIG 82 06/10/2021   Lab Results  Component Value Date   CHOLHDL 3.5 06/10/2021   Lab Results  Component Value Date   HGBA1C 4.8 06/10/2021       Assessment & Plan:  Left foot pain -     DG Foot Complete Left; Future  Assessment and Plan Assessment & Plan Left foot injury, rule out fracture   She sustained an acute left foot injury on Sunday after dropping a large frame on it. Pain is significant, especially with walking, toe movement, or foot flexion. Differential diagnosis includes fracture or severe contusion. No prior history of fractures and not pregnant. Ordered an x-ray of the left foot to assess for fracture. Provided a post-op shoe with a hard bottom to prevent foot bending and wrapped the foot with an ACE bandage for support. If the x-ray shows a fracture, will consider referral to orthopedics for further management.    Cory Rama R Lowne Chase, DO

## 2025-01-03 ENCOUNTER — Telehealth: Admitting: Emergency Medicine

## 2025-01-03 ENCOUNTER — Other Ambulatory Visit (HOSPITAL_COMMUNITY): Payer: Self-pay

## 2025-01-03 DIAGNOSIS — J069 Acute upper respiratory infection, unspecified: Secondary | ICD-10-CM | POA: Diagnosis not present

## 2025-01-03 MED ORDER — AZELASTINE HCL 0.1 % NA SOLN
2.0000 | Freq: Two times a day (BID) | NASAL | 0 refills | Status: AC
  Filled 2025-01-03: qty 30, 25d supply, fill #0

## 2025-01-03 MED ORDER — BENZONATATE 100 MG PO CAPS
100.0000 mg | ORAL_CAPSULE | Freq: Two times a day (BID) | ORAL | 0 refills | Status: AC | PRN
  Filled 2025-01-03: qty 20, 10d supply, fill #0

## 2025-01-03 NOTE — Progress Notes (Signed)
 E visit for Flu like symptoms  Let me know if you need a note for work.   We are sorry that you are not feeling well.  Here is how we plan to help! Based on what you have shared with me it looks like you may have flu-like symptoms that should be watched but do not seem to indicate anti-viral treatment.  Influenza or the flu is  an infection caused by a respiratory virus. The flu virus is highly contagious and persons who did not receive their yearly flu vaccination may catch the flu from close contact.  We have anti-viral medications to treat the viruses that cause this infection. They are not a cure and only shorten the course of the infection. These prescriptions are most effective when they are given within the first 2 days of flu symptoms. Antiviral medications are indicated if you have a high risk of complications from the flu. You should  also consider an antiviral medication if you are in close contact with someone who is at risk. These medications can help patients avoid complications from the flu but have side effects that you should know.   For nasal congestion, you may use an oral decongestant such as Mucinex D or if you have glaucoma or high blood pressure use plain Mucinex.  Saline nasal spray or nasal drops can help and can safely be used as often as needed for congestion.  If you have a sore or scratchy throat, use a saltwater gargle-  to  teaspoon of salt dissolved in a 4-ounce to 8-ounce glass of warm water.  Gargle the solution for approximately 15-30 seconds and then spit.  It is important not to swallow the solution.  You can also use throat lozenges/cough drops and Chloraseptic spray to help with throat pain or discomfort.  Warm or cold liquids can also be helpful in relieving throat pain.  For headache, pain or general discomfort, you can use Ibuprofen  or Tylenol  as directed.   Some authorities believe that zinc sprays or the use of Echinacea may shorten the course of  your symptoms.  I have prescribed the following medications to help lessen symptoms: I have prescribed Tessalon  Perles 100 mg. You may take 1-2 capsules every 8 hours as needed for cough and I have prescribed Azelastine  nasal spray 2 sprays in each nostril twice per day  You are to isolate at home until you have been fever-free for at least 24 hours without a fever-reducing medication, and symptoms have been steadily improving for 24 hours.  If you must be around other household members who do not have symptoms, you need to make sure that both you and the family members are masking consistently with a high-quality mask.  If you note any worsening of symptoms despite treatment, please seek an in-person evaluation ASAP. If you note any significant shortness of breath or any chest pain, please seek ED evaluation. Please do not delay care!  ANYONE WHO HAS FLU SYMPTOMS SHOULD: Stay home. The flu is highly contagious and going out or to work exposes others! Be sure to drink plenty of fluids. Water is fine as well as fruit juices, sodas and electrolyte beverages. You may want to stay away from caffeine or alcohol. If you are nauseated, try taking small sips of liquids. How do you know if you are getting enough fluid? Your urine should be a pale yellow or almost colorless. Get rest. Taking a steamy shower or using a humidifier may help nasal congestion  and ease sore throat pain. Using a saline nasal spray works much the same way. Cough drops, hard candies and sore throat lozenges may ease your cough. Line up a caregiver. Have someone check on you regularly.  GET HELP RIGHT AWAY IF: You cannot keep down liquids or your medications. You become short of breath Your fell like you are going to pass out or loose consciousness. Your symptoms persist after you have completed your treatment plan  MAKE SURE YOU  Understand these instructions. Will watch your condition. Will get help right away if you are not  doing well or get worse.  Your e-visit answers were reviewed by a board certified advanced clinical practitioner to complete your personal care plan.  Depending on the condition, your plan could have included both over the counter or prescription medications.  If there is a problem please reply  once you have received a response from your provider.  Your safety is important to us .  If you have drug allergies check your prescription carefully.    You can use MyChart to ask questions about todays visit, request a non-urgent call back, or ask for a work or school excuse for 24 hours related to this e-Visit. If it has been greater than 24 hours you will need to follow up with your provider, or enter a new e-Visit to address those concerns.  You will get an e-mail in the next two days asking about your experience.  I hope that your e-visit has been valuable and will speed your recovery. Thank you for using e-visits.   I have spent 5 minutes in review of e-visit questionnaire, review and updating patient chart, medical decision making and response to patient.   Jon Belt, PhD, FNP-BC

## 2025-01-08 ENCOUNTER — Other Ambulatory Visit (HOSPITAL_COMMUNITY): Payer: Self-pay

## 2025-01-08 ENCOUNTER — Telehealth: Admitting: Physician Assistant

## 2025-01-08 DIAGNOSIS — J208 Acute bronchitis due to other specified organisms: Secondary | ICD-10-CM

## 2025-01-08 MED ORDER — ALBUTEROL SULFATE HFA 108 (90 BASE) MCG/ACT IN AERS
2.0000 | INHALATION_SPRAY | Freq: Four times a day (QID) | RESPIRATORY_TRACT | 0 refills | Status: AC | PRN
  Filled 2025-01-08: qty 6.7, 25d supply, fill #0

## 2025-01-08 NOTE — Progress Notes (Signed)
 We are sorry that you are not feeling well.  Here is how we plan to help!  Based on your presentation I believe you most likely have A cough due to a virus.  This is called viral bronchitis and is best treated by rest, plenty of fluids and control of the cough.  You may use Ibuprofen  or Tylenol  as directed to help your symptoms.     In addition you may use A non-prescription cough medication called Mucinex DM: take 2 tablets every 12 hours.  I have added on an albuterol  inhaler to use as directed to relax your airway and calm down spasm in the lungs contributing to coughing spells and wheezing.  From your responses in the eVisit questionnaire you describe inflammation in the upper respiratory tract which is causing a significant cough.  This is commonly called Bronchitis and has four common causes:   Allergies Viral Infections Acid Reflux Bacterial Infection Allergies, viruses and acid reflux are treated by controlling symptoms or eliminating the cause. An example might be a cough caused by taking certain blood pressure medications. You stop the cough by changing the medication. Another example might be a cough caused by acid reflux. Controlling the reflux helps control the cough.  USE OF BRONCHODILATOR (RESCUE) INHALERS: There is a risk from using your bronchodilator too frequently.  The risk is that over-reliance on a medication which only relaxes the muscles surrounding the breathing tubes can reduce the effectiveness of medications prescribed to reduce swelling and congestion of the tubes themselves.  Although you feel brief relief from the bronchodilator inhaler, your asthma may actually be worsening with the tubes becoming more swollen and filled with mucus.  This can delay other crucial treatments, such as oral steroid medications. If you need to use a bronchodilator inhaler daily, several times per day, you should discuss this with your provider.  There are probably better treatments that  could be used to keep your asthma under control.     HOME CARE Only take medications as instructed by your medical team. Complete the entire course of an antibiotic. Drink plenty of fluids and get plenty of rest. Avoid close contacts especially the very young and the elderly Cover your mouth if you cough or cough into your sleeve. Always remember to wash your hands A steam or ultrasonic humidifier can help congestion.   GET HELP RIGHT AWAY IF: You develop worsening fever. You become short of breath You cough up blood. Your symptoms persist after you have completed your treatment plan MAKE SURE YOU  Understand these instructions. Will watch your condition. Will get help right away if you are not doing well or get worse.  Your e-visit answers were reviewed by a board certified advanced clinical practitioner to complete your personal care plan.  Depending on the condition, your plan could have included both over the counter or prescription medications. If there is a problem please reply  once you have received a response from your provider. Your safety is important to us .  If you have drug allergies check your prescription carefully.    You can use MyChart to ask questions about todays visit, request a non-urgent call back, or ask for a work or school excuse for 24 hours related to this e-Visit. If it has been greater than 24 hours you will need to follow up with your provider, or enter a new e-Visit to address those concerns. You will get an e-mail in the next two days asking about your experience.  I hope that your e-visit has been valuable and will speed your recovery. Thank you for using e-visits.   I have spent 5 minutes in review of e-visit questionnaire, review and updating patient chart, medical decision making and response to patient.   [name], PA-C

## 2025-03-16 ENCOUNTER — Encounter: Admitting: Family
